# Patient Record
Sex: Female | Born: 1960 | Race: White | Hispanic: No | Marital: Married | Smoking: Former smoker
Health system: Southern US, Community
[De-identification: ages and names within clinical notes are randomized; demographics above are authoritative.]

## PROBLEM LIST (undated history)

## (undated) DIAGNOSIS — H269 Unspecified cataract: Secondary | ICD-10-CM

## (undated) DIAGNOSIS — L738 Other specified follicular disorders: Secondary | ICD-10-CM

## (undated) DIAGNOSIS — E039 Hypothyroidism, unspecified: Principal | ICD-10-CM

## (undated) DIAGNOSIS — C801 Malignant (primary) neoplasm, unspecified: Secondary | ICD-10-CM

## (undated) HISTORY — DX: Other specified follicular disorders: L73.8

## (undated) HISTORY — DX: Hypothyroidism, unspecified: E03.9

## (undated) HISTORY — PX: BREAST REDUCTION SURGERY: SHX8

## (undated) HISTORY — DX: Malignant (primary) neoplasm, unspecified: C80.1

## (undated) HISTORY — DX: Unspecified cataract: H26.9

---

## 1998-02-13 ENCOUNTER — Other Ambulatory Visit: Admission: RE | Admit: 1998-02-13 | Discharge: 1998-02-13 | Payer: Self-pay | Admitting: Gynecology

## 1999-05-19 ENCOUNTER — Other Ambulatory Visit: Admission: RE | Admit: 1999-05-19 | Discharge: 1999-05-19 | Payer: Self-pay | Admitting: Gynecology

## 1999-10-06 ENCOUNTER — Ambulatory Visit (HOSPITAL_COMMUNITY): Admission: RE | Admit: 1999-10-06 | Discharge: 1999-10-06 | Payer: Self-pay | Admitting: Gastroenterology

## 2000-06-03 ENCOUNTER — Other Ambulatory Visit: Admission: RE | Admit: 2000-06-03 | Discharge: 2000-06-03 | Payer: Self-pay | Admitting: Gynecology

## 2001-06-19 ENCOUNTER — Other Ambulatory Visit: Admission: RE | Admit: 2001-06-19 | Discharge: 2001-06-19 | Payer: Self-pay | Admitting: Gynecology

## 2002-07-11 ENCOUNTER — Other Ambulatory Visit: Admission: RE | Admit: 2002-07-11 | Discharge: 2002-07-11 | Payer: Self-pay | Admitting: Gynecology

## 2003-06-13 ENCOUNTER — Other Ambulatory Visit: Admission: RE | Admit: 2003-06-13 | Discharge: 2003-06-13 | Payer: Self-pay | Admitting: Gynecology

## 2004-07-15 ENCOUNTER — Other Ambulatory Visit: Admission: RE | Admit: 2004-07-15 | Discharge: 2004-07-15 | Payer: Self-pay | Admitting: Gynecology

## 2005-08-04 ENCOUNTER — Other Ambulatory Visit: Admission: RE | Admit: 2005-08-04 | Discharge: 2005-08-04 | Payer: Self-pay | Admitting: Gynecology

## 2005-12-13 ENCOUNTER — Encounter: Admission: RE | Admit: 2005-12-13 | Discharge: 2005-12-13 | Payer: Self-pay | Admitting: Family Medicine

## 2006-09-15 ENCOUNTER — Other Ambulatory Visit: Admission: RE | Admit: 2006-09-15 | Discharge: 2006-09-15 | Payer: Self-pay | Admitting: Gynecology

## 2007-02-08 ENCOUNTER — Encounter: Admission: RE | Admit: 2007-02-08 | Discharge: 2007-02-08 | Payer: Self-pay | Admitting: Gynecology

## 2007-12-08 ENCOUNTER — Encounter (INDEPENDENT_AMBULATORY_CARE_PROVIDER_SITE_OTHER): Payer: Self-pay | Admitting: *Deleted

## 2007-12-20 ENCOUNTER — Other Ambulatory Visit: Admission: RE | Admit: 2007-12-20 | Discharge: 2007-12-20 | Payer: Self-pay | Admitting: Gynecology

## 2008-02-27 ENCOUNTER — Encounter: Admission: RE | Admit: 2008-02-27 | Discharge: 2008-02-27 | Payer: Self-pay | Admitting: Gynecology

## 2009-01-20 ENCOUNTER — Ambulatory Visit: Payer: Self-pay | Admitting: Family Medicine

## 2009-01-20 DIAGNOSIS — E039 Hypothyroidism, unspecified: Secondary | ICD-10-CM | POA: Insufficient documentation

## 2009-01-20 DIAGNOSIS — L738 Other specified follicular disorders: Secondary | ICD-10-CM | POA: Insufficient documentation

## 2009-01-20 DIAGNOSIS — L678 Other hair color and hair shaft abnormalities: Secondary | ICD-10-CM

## 2009-01-20 HISTORY — DX: Hypothyroidism, unspecified: E03.9

## 2009-01-20 HISTORY — DX: Other hair color and hair shaft abnormalities: L67.8

## 2009-02-27 ENCOUNTER — Telehealth: Payer: Self-pay | Admitting: Family Medicine

## 2009-03-21 ENCOUNTER — Encounter: Admission: RE | Admit: 2009-03-21 | Discharge: 2009-03-21 | Payer: Self-pay | Admitting: Gynecology

## 2010-01-30 ENCOUNTER — Ambulatory Visit: Payer: Self-pay | Admitting: Family Medicine

## 2010-01-30 LAB — CONVERTED CEMR LAB: TSH: 3.18 microintl units/mL (ref 0.35–5.50)

## 2010-03-19 NOTE — Assessment & Plan Note (Signed)
Summary: [facility] -[facility] TSH//CCM/PT RSC/CJR   Vital Signs:  Patient profile:   50 year old female Menstrual status:  postmenopausal Weight:      133 pounds Temp:     97.5 degrees F oral BP sitting:   90 / 70  (left arm) Cuff size:   regular  Vitals Entered By: Sid Falcon LPN (January 30, 2010 11:07 AM)  History of Present Illness: Patient seen for follow up hypothyroidism. Takes Levothroid 75 micrograms daily.  Compliant with therapy. No fatigue or other issues. She denies any constipation or cold intolerance. Weight stable.  Requesting name of other local GYN physicians.  Has seen Dr Chevis Pretty in past and would like to change.  Allergies (verified): No Known Drug Allergies  Past History:  Past Medical History: Last updated: 01/20/2009 hypothyroid  Family History: Last updated: 01/20/2009 Family History Lung cancer  Social History: Last updated: 01/20/2009 Occupation:  Futures trader, [employer] Married Alcohol use-yes Regular exercise-yes Never Smoked  Risk Factors: Exercise: yes (01/20/2009)  Risk Factors: Smoking Status: never (01/20/2009)  Review of Systems  The patient denies anorexia, fever, weight loss, weight gain, chest pain, and peripheral edema.    Physical Exam  General:  Well-developed,well-nourished,in no acute distress; alert,appropriate and cooperative throughout examination Mouth:  Oral mucosa and oropharynx without lesions or exudates.  Teeth in good repair. Neck:  No deformities, masses, or tenderness noted. Lungs:  Normal respiratory effort, chest expands symmetrically. Lungs are clear to auscultation, no crackles or wheezes. Heart:  Normal rate and regular rhythm. S1 and S2 normal without gallop, murmur, click, rub or other extra sounds. Extremities:  No clubbing, cyanosis, edema, or deformity noted with normal full range of motion of all joints.     Impression & Recommendations:  Problem # 1:  HYPOTHYROIDISM (ICD-244.9) repeat  labs. Her updated medication list for this problem includes:    Levothroid 75 Mcg Tabs (Levothyroxine sodium) ..... Once daily  Orders: TLB-TSH (Thyroid Stimulating Hormone) (84443-TSH)  Problem # 2:  Preventive Health Care (ICD-V70.0) No hx of Tdap and expecting grandchild in June.  Pt consents.  Complete Medication List: 1)  Levothroid 75 Mcg Tabs (Levothyroxine sodium) .... Once daily 2)  Doxycycline Hyclate 100 Mg Caps (Doxycycline hyclate) .... One by mouth two times a day for 10 days 3)  Perque Potent C Guard  .... Dietary supplement one tsp daily with 8 oz h20 4)  D-3 1000  .... Once daily 5)  Vitamin B-12 2500 Mcg Subl (Cyanocobalamin) .... Once daily  Other Orders: Tdap => 39yrs IM (06237) Admin 1st Vaccine (62831)  Patient Instructions: 1)  GYNs: 2)  Huel Cote   [phone] 3)  Floyde Parkins     [phone] 4)  Please schedule a follow-up appointment in 1 year.    Orders Added: 1)  TLB-TSH (Thyroid Stimulating Hormone) [84443-TSH] 2)  Tdap => 14yrs IM [90715] 3)  Admin 1st Vaccine [90471] 4)  Est. Patient Level III [69485]   Immunizations Administered:  Tetanus Vaccine:    Vaccine Type: Tdap    Site: left deltoid    Mfr: GlaxoSmithKline    Dose: 0.5 ml    Route: IM    Given by: Sid Falcon LPN    Exp. Date: 12/06/2011    Lot #: [id number]    VIS given: 01/03/08 version given January 30, 2010.   Immunizations Administered:  Tetanus Vaccine:    Vaccine Type: Tdap    Site: left deltoid    Mfr: GlaxoSmithKline  Dose: 0.5 ml    Route: IM    Given by: Sid Falcon LPN    Exp. Date: 12/06/2011    Lot #: [id number]    VIS given: 01/03/08 version given January 30, 2010.

## 2010-03-19 NOTE — Progress Notes (Signed)
Summary: fyi  Phone Note Call from Patient Call back at Home Phone [phone]   Caller: Patient Call For: Evelena Peat MD Summary of Call: Pt rash on chest has cleared up. abx along with hydrocortisone cream did the job. Initial call taken by: Heron Sabins,  February 27, 2009 10:20 AM  Follow-up for Phone Call        noted Follow-up by: Evelena Peat MD,  February 27, 2009 10:30 AM

## 2010-05-08 ENCOUNTER — Other Ambulatory Visit: Payer: Self-pay | Admitting: Obstetrics and Gynecology

## 2010-05-08 DIAGNOSIS — Z79899 Other long term (current) drug therapy: Secondary | ICD-10-CM

## 2010-05-08 DIAGNOSIS — Z1231 Encounter for screening mammogram for malignant neoplasm of breast: Secondary | ICD-10-CM

## 2010-06-03 ENCOUNTER — Ambulatory Visit
Admission: RE | Admit: 2010-06-03 | Discharge: 2010-06-03 | Disposition: A | Discharge status: Home / Self Care | Payer: BC Managed Care – PPO | Source: Ambulatory Visit | Attending: Obstetrics and Gynecology | Admitting: Obstetrics and Gynecology

## 2010-06-03 DIAGNOSIS — Z79899 Other long term (current) drug therapy: Secondary | ICD-10-CM

## 2010-06-03 DIAGNOSIS — Z1231 Encounter for screening mammogram for malignant neoplasm of breast: Secondary | ICD-10-CM

## 2010-06-11 ENCOUNTER — Telehealth: Payer: Self-pay | Admitting: Family Medicine

## 2010-06-11 NOTE — Telephone Encounter (Signed)
Pt called and said that Dr Senaida Ores, OBGYN, prescribed pt a med for osteoporosis. Pt had a dexa scan done and pt wanted to make Dr Tawanna Cooler aware. Pt does not know name of med, believes it may be Fosomax.

## 2010-07-03 NOTE — Procedures (Signed)
[facility]  Patient:    Sally Brooks, Sally Brooks                     MRN: [id number] Proc. Date: 10/06/99 Adm. Date:  [id number] Disc. Date: [id number] Attending:  Deneen Harts CC:         Leatha Gilding. Mezer, M.D.  [facility]   Procedure Report  PROCEDURE:  Colonoscopy.  ENDOSCOPIST:  Griffith Citron, M.D.  INDICATIONS:  Colorectal neoplasia surveillance, constipation.  INFORMED CONSENT:  After reviewing the nature of the procedure with the patient including potential risks and complications including those of hemorrhage and perforation, informed consent was signed.  DESCRIPTION OF PROCEDURE:  The patient was premedicated receiving Versed 8 mg, fentanyl 75 mcg administered in divided doses prior to and during the course of the procedure.  Using the Olympus PCF-140L pediatric video colonoscope, the rectum was intubated after digital examination revealed no evidence of perianal or intrarectal pathology.  The scope was inserted and advanced without difficulty around the entire length of the colon to the cecum.  This was identified by the appendiceal orifice and ileocecal valve.  Preparation was good throughout. There was some retained Bisacodyl tablets.  The scope was slowly withdrawn with careful inspection of the entire colon in a retrograde manner.  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  There was no evidence throughout the colon of neoplasia.  No other abnormality was noted. No evidence of diverticular disease, mucosal inflammation or vascular lesion. A retroflexed view in the rectal vault was normal.  The colon was decompressed and scope withdrawn.  The patient tolerated the procedure without difficulty being maintained on Datascope monitor and low flow oxygen throughout.  Time 1, technical 1, preparation 2, total score equals 4.  ASSESSMENT: 1. 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 2. No colorectal neoplasia identified. 3. Functional constipation.  RECOMMENDATION: 1. Colonoscopy to be repeated in 10 years - per Dr. Kinnie Scales. 2. Annual Hemoccults, per Dr. Chevis Pretty and/or [facility]. 3. Miralax - titrate dose to achieve daily or every other day bowel movement. 4. Return office visit p.r.n. if constipation persists. DD:  10/06/99 TD:  10/07/99 Job: [id number]

## 2010-12-17 ENCOUNTER — Telehealth: Payer: Self-pay | Admitting: Family Medicine

## 2010-12-17 NOTE — Telephone Encounter (Signed)
Really should be seen but OK to call in Z-max and needs to be seen if no better by next week.

## 2010-12-17 NOTE — Telephone Encounter (Signed)
Pt agreed to come in tomorrow morning 8:30 am

## 2010-12-17 NOTE — Telephone Encounter (Signed)
Pt is calling again for cough RX. With a Zpack,  No fever.

## 2010-12-17 NOTE — Telephone Encounter (Signed)
Pt has has a hacking cough going on 4 weeks and can not seem to shake it. Pt would like to know if she could have a Zpack called in. Pt requesting you contact her

## 2010-12-18 ENCOUNTER — Ambulatory Visit (INDEPENDENT_AMBULATORY_CARE_PROVIDER_SITE_OTHER): Payer: BC Managed Care – PPO | Admitting: Family Medicine

## 2010-12-18 ENCOUNTER — Encounter: Payer: Self-pay | Admitting: Family Medicine

## 2010-12-18 DIAGNOSIS — E039 Hypothyroidism, unspecified: Secondary | ICD-10-CM

## 2010-12-18 DIAGNOSIS — R05 Cough: Secondary | ICD-10-CM

## 2010-12-18 MED ORDER — HYDROCODONE-HOMATROPINE 5-1.5 MG/5ML PO SYRP: 5.0000 mL | ORAL_SOLUTION | Freq: Four times a day (QID) | ORAL | Status: AC | PRN

## 2010-12-18 MED ORDER — AZITHROMYCIN 250 MG PO TABS: ORAL_TABLET | ORAL | Status: AC

## 2010-12-18 MED ORDER — LEVOTHYROXINE SODIUM 75 MCG PO TABS: 75.0000 ug | ORAL_TABLET | Freq: Every day | ORAL | Status: DC

## 2010-12-18 NOTE — Patient Instructions (Signed)
Follow up if cough not improving by end of next week.

## 2010-12-18 NOTE — Progress Notes (Signed)
  Subjective:    Patient ID: Sally Brooks, female    DOB: 01-27-1961, 50 y.o.   MRN: [id number]  HPI  Patient seen with 4 week history of cough. Mostly nonproductive. Patient is nonsmoker. No history of asthma. Some postnasal drip. No fever. Denies any GERD symptoms. No wheezing. He tried over-the-counter cough medications and Sudafed along with antihistamine without much improvement. Cough especially bothersome at night. No dyspnea.  Hypothyroidism. Needs repeat TSH. Needs refills of medication. Compliant with therapy.   Review of Systems  Constitutional: Negative for fever, chills, appetite change, fatigue and unexpected weight change.  Respiratory: Positive for cough. Negative for shortness of breath and wheezing.   Cardiovascular: Negative for chest pain, palpitations and leg swelling.       Objective:   Physical Exam  Constitutional: She appears well-developed and well-nourished.  HENT:  Right Ear: External ear normal.  Left Ear: External ear normal.  Mouth/Throat: Oropharynx is clear and moist.  Neck: Neck supple.  Cardiovascular: Normal rate and regular rhythm.   No murmur heard. Pulmonary/Chest: Effort normal and breath sounds normal. No respiratory distress. She has no wheezes. She has no rales.  Musculoskeletal: She exhibits no edema.  Lymphadenopathy:    She has no cervical adenopathy.          Assessment & Plan:  #1 cough. Differential includes viral bronchitis versus atypical. Start Zithromax. Hycodan for nighttime cough #2 hypothyroidism. Recheck TSH. Refill Synthroid for one year

## 2010-12-22 NOTE — Progress Notes (Signed)
Quick Note:  Pt informed ______ 

## 2011-01-28 ENCOUNTER — Ambulatory Visit (INDEPENDENT_AMBULATORY_CARE_PROVIDER_SITE_OTHER): Payer: BC Managed Care – PPO | Admitting: Family Medicine

## 2011-01-28 ENCOUNTER — Encounter: Payer: Self-pay | Admitting: Family Medicine

## 2011-01-28 VITALS — BP 100/68 | Temp 98.2°F | Wt 139.0 lb

## 2011-01-28 DIAGNOSIS — J029 Acute pharyngitis, unspecified: Secondary | ICD-10-CM

## 2011-01-28 LAB — POCT RAPID STREP A (OFFICE): Rapid Strep A Screen: NEGATIVE

## 2011-01-28 MED ORDER — AMOXICILLIN 875 MG PO TABS: 875.0000 mg | ORAL_TABLET | Freq: Two times a day (BID) | ORAL | Status: AC

## 2011-01-28 NOTE — Progress Notes (Signed)
  Subjective:    Patient ID: Sally Brooks, female    DOB: [birth year], 50 y.o.   MRN: [id number]  HPI  Sore throat. Onset yesterday. Diffuse bodyaches. Minimal indigestion. Intermittent mild headache. Increased malaise. Denies any significant cough. History of frequent strep pharyngitis the past and felt similar them. No recent ill exposures. Denies any nausea or vomiting. No skin rash. Mild improvement with Advil   Review of Systems As per history of present illness    Objective:   Physical Exam  Constitutional: She appears well-developed and well-nourished.  HENT:  Right Ear: External ear normal.  Left Ear: External ear normal.       Erythema posterior pharynx without exudate  Neck: Neck supple.  Cardiovascular: Normal rate and regular rhythm.   Pulmonary/Chest: Effort normal and breath sounds normal. No respiratory distress. She has no wheezes. She has no rales.  Lymphadenopathy:    She has cervical adenopathy (anterior cervical adenopathy).  Skin: No rash noted.          Assessment & Plan:  Pharyngitis. Rapid strep negative. Discussed ddx viral vs strep with false neg screen. She is concerned because of history of frequent strep in the past. Given lack of cough and minimal nasal congestion start amoxicillin 875 mg twice daily for 10 days

## 2011-01-29 ENCOUNTER — Other Ambulatory Visit: Payer: Self-pay | Admitting: Family Medicine

## 2011-02-24 ENCOUNTER — Other Ambulatory Visit: Payer: Self-pay | Admitting: *Deleted

## 2011-02-24 MED ORDER — LEVOTHYROXINE SODIUM 75 MCG PO TABS: 75.0000 ug | ORAL_TABLET | Freq: Every day | ORAL | Status: DC

## 2011-03-01 ENCOUNTER — Telehealth: Payer: Self-pay | Admitting: *Deleted

## 2011-03-01 NOTE — Telephone Encounter (Signed)
error 

## 2011-06-30 ENCOUNTER — Other Ambulatory Visit: Payer: Self-pay | Admitting: Obstetrics and Gynecology

## 2011-06-30 DIAGNOSIS — Z1231 Encounter for screening mammogram for malignant neoplasm of breast: Secondary | ICD-10-CM

## 2011-07-14 ENCOUNTER — Ambulatory Visit
Admission: RE | Admit: 2011-07-14 | Discharge: 2011-07-14 | Disposition: A | Discharge status: Home / Self Care | Payer: BC Managed Care – PPO | Source: Ambulatory Visit | Attending: Obstetrics and Gynecology | Admitting: Obstetrics and Gynecology

## 2011-07-14 DIAGNOSIS — Z1231 Encounter for screening mammogram for malignant neoplasm of breast: Secondary | ICD-10-CM

## 2011-08-11 ENCOUNTER — Ambulatory Visit (INDEPENDENT_AMBULATORY_CARE_PROVIDER_SITE_OTHER): Payer: BC Managed Care – PPO | Admitting: Family Medicine

## 2011-08-11 ENCOUNTER — Encounter: Payer: Self-pay | Admitting: Family Medicine

## 2011-08-11 VITALS — BP 102/62 | Temp 98.0°F | Wt 146.0 lb

## 2011-08-11 DIAGNOSIS — R300 Dysuria: Secondary | ICD-10-CM

## 2011-08-11 LAB — POCT URINALYSIS DIPSTICK
Bilirubin, UA: NEGATIVE
Glucose, UA: NEGATIVE
Nitrite, UA: NEGATIVE

## 2011-08-11 MED ORDER — NITROFURANTOIN MONOHYD MACRO 100 MG PO CAPS: 100.0000 mg | ORAL_CAPSULE | Freq: Two times a day (BID) | ORAL | Status: DC

## 2011-08-11 NOTE — Progress Notes (Signed)
  Subjective:    Patient ID: Sally Brooks, female    DOB: Mar 24, 1960, 51 y.o.   MRN: [id number]  HPI  Dysuria. Onset one week ago. Urine frequency and burning with urination. Denies any nausea or vomiting. Intermittent left lower back pain. No fever or chills. No history of known allergies. No history of recent UTI.   Review of Systems  Constitutional: Negative for fever, chills and appetite change.  Gastrointestinal: Negative for nausea, vomiting, abdominal pain, diarrhea and constipation.  Genitourinary: Positive for dysuria and frequency.  Musculoskeletal: Negative for back pain.  Neurological: Negative for dizziness.       Objective:   Physical Exam  Constitutional: She appears well-developed and well-nourished.  HENT:  Head: Normocephalic and atraumatic.  Neck: Neck supple. No thyromegaly present.  Cardiovascular: Normal rate, regular rhythm and normal heart sounds.   Pulmonary/Chest: Breath sounds normal.  Abdominal: Soft. Bowel sounds are normal. There is no tenderness.          Assessment & Plan:  Uncomplicated cystitis. Macrobid one twice a day for 5 days. Plenty fluids. Followup when necessary

## 2011-08-11 NOTE — Patient Instructions (Signed)

## 2011-12-07 ENCOUNTER — Telehealth: Payer: Self-pay | Admitting: Family Medicine

## 2011-12-07 MED ORDER — NITROFURANTOIN MONOHYD MACRO 100 MG PO CAPS: 100.0000 mg | ORAL_CAPSULE | Freq: Two times a day (BID) | ORAL | Status: AC

## 2011-12-07 NOTE — Telephone Encounter (Signed)
May refill Macrobid once but will need UA if sxs persist.

## 2011-12-07 NOTE — Telephone Encounter (Signed)
Med was macrobid from history

## 2011-12-07 NOTE — Telephone Encounter (Signed)
Pt informed, Rx sent, very appreciative

## 2011-12-07 NOTE — Telephone Encounter (Signed)
I called pt and informed her she would need OV so we can check her urine again, etc.  I offered assistance with scheduling with another provider and she was insistent that I ask the doctor, "last time the med worked so well in just 3 days, please ask him"?

## 2011-12-07 NOTE — Telephone Encounter (Signed)
Pt was seen on 08-11-2011 for uti. Pt is experiencing same symptoms patient would like same abx call into [facility]

## 2012-03-15 ENCOUNTER — Ambulatory Visit (INDEPENDENT_AMBULATORY_CARE_PROVIDER_SITE_OTHER): Payer: BC Managed Care – PPO | Admitting: Family Medicine

## 2012-03-15 ENCOUNTER — Encounter: Payer: Self-pay | Admitting: Family Medicine

## 2012-03-15 VITALS — BP 100/70 | Temp 98.4°F | Wt 144.0 lb

## 2012-03-15 DIAGNOSIS — E039 Hypothyroidism, unspecified: Secondary | ICD-10-CM

## 2012-03-15 MED ORDER — LEVOTHYROXINE SODIUM 75 MCG PO TABS: 75.0000 ug | ORAL_TABLET | Freq: Every day | ORAL | Status: DC

## 2012-03-15 NOTE — Progress Notes (Signed)
  Subjective:    Patient ID: Sally Brooks, female    DOB: 1960/08/12, 52 y.o.   MRN: [id number]  HPI Followup hypothyroidism. Takes levothyroxin 75 mcg daily. Generally feels well. She saw a urologist last year for some stress urinary incontinence. She's not decided to pursue surgery this time. Generally has good energy levels. Weight stable. Compliant with medication. Nonsmoker.  Past Medical History  Diagnosis Date  . HYPOTHYROIDISM 01/20/2009  . FOLLICULITIS 01/20/2009   No past surgical history on file.  reports that she quit smoking about 33 years ago. Her smoking use included Cigarettes. She has a 2.5 pack-year smoking history. She does not have any smokeless tobacco history on file. Her alcohol and drug histories not on file. family history includes Cancer in her other. No Known Allergies    Review of Systems  Constitutional: Negative for appetite change and unexpected weight change.  Respiratory: Negative for shortness of breath.   Cardiovascular: Negative for chest pain.       Objective:   Physical Exam  Constitutional: She appears well-developed and well-nourished.  Neck: Neck supple. No thyromegaly present.  Cardiovascular: Normal rate and regular rhythm.   Pulmonary/Chest: Effort normal and breath sounds normal. No respiratory distress. She has no wheezes. She has no rales.  Musculoskeletal: She exhibits no edema.  Lymphadenopathy:    She has no cervical adenopathy.          Assessment & Plan:  #1 hypothyroidism. Recheck TSH. Refill medication for one year. She continues see gynecologist for physicals

## 2012-03-16 NOTE — Progress Notes (Signed)
Quick Note:  Pt informed ______ 

## 2012-09-07 ENCOUNTER — Ambulatory Visit (INDEPENDENT_AMBULATORY_CARE_PROVIDER_SITE_OTHER): Payer: BC Managed Care – PPO | Admitting: Nurse Practitioner

## 2012-09-07 ENCOUNTER — Encounter: Payer: Self-pay | Admitting: Nurse Practitioner

## 2012-09-07 VITALS — BP 100/60 | HR 91 | Temp 97.4°F | Ht 61.0 in | Wt 140.5 lb

## 2012-09-07 DIAGNOSIS — L255 Unspecified contact dermatitis due to plants, except food: Secondary | ICD-10-CM

## 2012-09-07 MED ORDER — METHYLPREDNISOLONE ACETATE 40 MG/ML IJ SUSP
40.0000 mg | Freq: Once | INTRAMUSCULAR | Status: AC
  Administered 2012-09-07: 40 mg via INTRAMUSCULAR

## 2012-09-07 MED ORDER — PREDNISONE 10 MG PO TABS: ORAL_TABLET | ORAL | Status: DC

## 2012-09-07 NOTE — Patient Instructions (Signed)
Continue with oral prednisone. Do not take any more today. Tomorrow start with prescription I have written. This rash will gradually resolve over the next 10-15 days. Good skin care is ZOX:WRUE soaps, bacitracin for red irritated areas. Topical benadryl and ice packs are helpful for itch. Heat is always going to make rash more red & itchy, so after showers you may notice increased irritation. You may also take claritin 10 mg daily until rash resolves. Feel better!  Poison [employer] ivy is a inflammation of the skin (contact dermatitis) caused by touching the allergens on the leaves of the ivy plant following previous exposure to the plant. The rash usually appears 48 hours after exposure. The rash is usually bumps (papules) or blisters (vesicles) in a linear pattern. Depending on your own sensitivity, the rash may simply cause redness and itching, or it may also progress to blisters which may break open. These must be well cared for to prevent secondary bacterial (germ) infection, followed by scarring. Keep any open areas dry, clean, dressed, and covered with an antibacterial ointment if needed. The eyes may also get puffy. The puffiness is worst in the morning and gets better as the day progresses. This dermatitis usually heals without scarring, within 2 to 3 weeks without treatment. HOME CARE INSTRUCTIONS  Thoroughly wash with soap and water as soon as you have been exposed to poison ivy. You have about one half hour to remove the plant resin before it will cause the rash. This washing will destroy the oil or antigen on the skin that is causing, or will cause, the rash. Be sure to wash under your fingernails as any plant resin there will continue to spread the rash. Do not rub skin vigorously when washing affected area. Poison ivy cannot spread if no oil from the plant remains on your body. A rash that has progressed to weeping sores will not spread the rash unless you have not washed thoroughly. It is also  important to wash any clothes you have been wearing as these may carry active allergens. The rash will return if you wear the unwashed clothing, even several days later. Avoidance of the plant in the future is the best measure. Poison ivy plant can be recognized by the number of leaves. Generally, poison ivy has three leaves with flowering branches on a single stem. Diphenhydramine may be purchased over the counter and used as needed for itching. Do not drive with this medication if it makes you drowsy.Ask your caregiver about medication for children. SEEK MEDICAL CARE IF:  Open sores develop.  Redness spreads beyond area of rash.  You notice purulent (pus-like) discharge.  You have increased pain.  Other signs of infection develop (such as fever). Document Released: 01/30/2000 Document Revised: 04/26/2011 Document Reviewed: 12/18/2008 [facility] Patient Information 2014 [place], [place].

## 2012-09-07 NOTE — Progress Notes (Signed)
  Subjective:    Patient ID: Sally Brooks, female    DOB: [birth year], 52 y.o.   MRN: [id number]  Poison [place] This is a new problem. The current episode started in the past 7 days (Rash for 7 days). The problem has been gradually worsening since onset. The affected locations include the face and right arm. The rash is characterized by blistering, itchiness, redness and swelling. She was exposed to plant contact. Associated symptoms include facial edema. Pertinent negatives include no cough, fever or shortness of breath. Past treatments include oral steroids (pt has been taking prednisone taper for last 6 days-3D of 60mg , 3D of 40mg ). The treatment provided no relief. There is no history of asthma.      Review of Systems  Constitutional: Negative for fever.  HENT: Positive for facial swelling. Negative for trouble swallowing and voice change.   Eyes: Negative for redness.  Respiratory: Negative for cough, chest tightness, shortness of breath and wheezing.   Gastrointestinal: Negative for abdominal pain.  Skin: Positive for rash (various stages of blistering rash on R side of face, neck, R hand & arm.).       Objective:   Physical Exam  Vitals reviewed. Constitutional: She is oriented to person, place, and time. She appears well-developed and well-nourished. She appears distressed (mildly distressed due to itchiness of rash).  HENT:  Head: Normocephalic and atraumatic.  Eyes: Conjunctivae are normal. Right eye exhibits no discharge. Left eye exhibits no discharge.  Cardiovascular: Normal rate, regular rhythm and normal heart sounds.   No murmur heard. Pulmonary/Chest: Effort normal and breath sounds normal. No respiratory distress. She has no wheezes.  Musculoskeletal:  Normal gait  Neurological: She is alert and oriented to person, place, and time.  Skin: Skin is warm and dry. Rash (  various stages of blistering rash on R side of face, neck, R hand & arm) noted.  Psychiatric: She has  a normal mood and affect. Her behavior is normal. Thought content normal.  Has felt anxious last few days, thinks r/t son going into marines.          Assessment & Plan:  1. Plant dermatitis Discussed need for 14-21 days of oral pred taper, typical duration of rash, SE of prednisone-including anxiety. Pt is insistent that a "steroid injection" will make her better. Cautioned that it may make her anxiety temporarily worse, and is not likely to make great strides with improving rash since she has been taking oral prednisone for 6 days already.  - methylPREDNISolone acetate (DEPO-MEDROL) injection 40 mg; Inject 1 mL (40 mg total) into the muscle once. - predniSONE (DELTASONE) 10 MG tablet; Take 3T po QD for 3d, then 2T po qd 3D, then 1T po qd 3D, then 1/2 T po QD 3D  Dispense: 20 tablet; Refill: 0  See instructions for skin care.

## 2012-09-08 ENCOUNTER — Ambulatory Visit: Payer: BC Managed Care – PPO | Admitting: Family Medicine

## 2012-09-13 ENCOUNTER — Telehealth: Payer: Self-pay | Admitting: *Deleted

## 2012-09-13 NOTE — Telephone Encounter (Signed)
Patient was in to see Layne on 09/07/12 for poison oak. Patient received a depo injection when she was here and some of the rash dried up. However patient stated that the has spread to other areas. Patient had been to urgent care before she saw Layne and urgent care gave her a prednisone rx. Patient hasn't taken the rx yet and she wants to make sure that the directions are right. Take 3 tablets qd for 3 days, then 2 tablets qd for 3 days, then 1 tablet qd for 3 days, then 1/2 tablet qd for 3 days. Please advise?

## 2012-09-13 NOTE — Telephone Encounter (Signed)
Per Dr. Milinda Cave. Called patient to let her know that she should follow directions when taking medication. Patient expressed understanding.

## 2013-01-01 ENCOUNTER — Other Ambulatory Visit: Payer: Self-pay

## 2013-01-01 ENCOUNTER — Other Ambulatory Visit: Payer: Self-pay | Admitting: Obstetrics and Gynecology

## 2013-01-01 DIAGNOSIS — M810 Age-related osteoporosis without current pathological fracture: Secondary | ICD-10-CM

## 2013-01-01 DIAGNOSIS — Z1231 Encounter for screening mammogram for malignant neoplasm of breast: Secondary | ICD-10-CM

## 2013-01-01 DIAGNOSIS — Z9889 Other specified postprocedural states: Secondary | ICD-10-CM

## 2013-02-09 ENCOUNTER — Other Ambulatory Visit: Payer: BC Managed Care – PPO

## 2013-02-13 ENCOUNTER — Other Ambulatory Visit: Payer: BC Managed Care – PPO

## 2013-02-13 ENCOUNTER — Ambulatory Visit: Payer: BC Managed Care – PPO

## 2013-02-19 ENCOUNTER — Ambulatory Visit
Admission: RE | Admit: 2013-02-19 | Discharge: 2013-02-19 | Disposition: A | Discharge status: Home / Self Care | Payer: BC Managed Care – PPO | Source: Ambulatory Visit | Attending: Obstetrics and Gynecology | Admitting: Obstetrics and Gynecology

## 2013-02-19 ENCOUNTER — Ambulatory Visit
Admission: RE | Admit: 2013-02-19 | Discharge: 2013-02-19 | Disposition: A | Discharge status: Home / Self Care | Payer: BC Managed Care – PPO | Source: Ambulatory Visit

## 2013-02-19 DIAGNOSIS — Z9889 Other specified postprocedural states: Secondary | ICD-10-CM

## 2013-02-19 DIAGNOSIS — M810 Age-related osteoporosis without current pathological fracture: Secondary | ICD-10-CM

## 2013-02-19 DIAGNOSIS — Z1231 Encounter for screening mammogram for malignant neoplasm of breast: Secondary | ICD-10-CM

## 2013-02-28 ENCOUNTER — Ambulatory Visit: Payer: BC Managed Care – PPO | Admitting: Family Medicine

## 2013-04-06 ENCOUNTER — Encounter: Payer: Self-pay | Admitting: Family Medicine

## 2013-04-06 ENCOUNTER — Ambulatory Visit (INDEPENDENT_AMBULATORY_CARE_PROVIDER_SITE_OTHER): Payer: BC Managed Care – PPO | Admitting: Family Medicine

## 2013-04-06 VITALS — BP 100/70 | HR 66 | Temp 97.6°F | Wt 150.0 lb

## 2013-04-06 DIAGNOSIS — Z13220 Encounter for screening for lipoid disorders: Secondary | ICD-10-CM

## 2013-04-06 DIAGNOSIS — E039 Hypothyroidism, unspecified: Secondary | ICD-10-CM

## 2013-04-06 DIAGNOSIS — M8580 Other specified disorders of bone density and structure, unspecified site: Secondary | ICD-10-CM

## 2013-04-06 DIAGNOSIS — M899 Disorder of bone, unspecified: Secondary | ICD-10-CM

## 2013-04-06 DIAGNOSIS — M949 Disorder of cartilage, unspecified: Secondary | ICD-10-CM

## 2013-04-06 LAB — LIPID PANEL
CHOL/HDL RATIO: 4
Cholesterol: 291 mg/dL — ABNORMAL HIGH (ref 0–200)
HDL: 68.3 mg/dL (ref >39.00)
TRIGLYCERIDES: 80 mg/dL (ref 0.0–149.0)
VLDL: 16 mg/dL (ref 0.0–40.0)

## 2013-04-06 LAB — LDL CHOLESTEROL, DIRECT: LDL DIRECT: 226.1 mg/dL

## 2013-04-06 LAB — TSH: TSH: 5.31 u[IU]/mL (ref 0.35–5.50)

## 2013-04-06 MED ORDER — LEVOTHYROXINE SODIUM 75 MCG PO TABS: 75.0000 ug | ORAL_TABLET | Freq: Every day | ORAL | Status: DC

## 2013-04-06 NOTE — Progress Notes (Signed)
   Subjective:    Patient ID: Sally Brooks, female    DOB: 1960/05/21, 53 y.o.   MRN: [id number]  HPI Patient seen for followup regarding hypothyroidism. Takes levothyroxin 75 mcg daily. Compliant with therapy. She has no specific complaints. She also relates history of osteoporosis versus osteopenia per DEXA scan from gynecologist. We do not have those records. She denies ever having vitamin D level checked.  Past Medical History  Diagnosis Date  . HYPOTHYROIDISM 01/20/2009  . FOLLICULITIS 01/0/9323   No past surgical history on file.  reports that she quit smoking about 34 years ago. Her smoking use included Cigarettes. She has a 2.5 pack-year smoking history. She does not have any smokeless tobacco history on file. Her alcohol and drug histories are not on file. family history includes Cancer in her other. No Known Allergies    Review of Systems  Constitutional: Negative for appetite change and unexpected weight change.  Respiratory: Negative for shortness of breath.   Cardiovascular: Negative for chest pain.  Gastrointestinal: Negative for constipation.  Endocrine: Negative for cold intolerance.  Neurological: Negative for dizziness and headaches.       Objective:   Physical Exam  Constitutional: She appears well-developed and well-nourished.  Neck: Neck supple. No thyromegaly present.  Cardiovascular: Normal rate and regular rhythm.   Pulmonary/Chest: Effort normal and breath sounds normal. No respiratory distress. She has no wheezes. She has no rales.  Musculoskeletal: She exhibits no edema.          Assessment & Plan:  Hypothyroidism. Recheck TSH. Refill levothyroxin for one year. Patient requesting lipid level and vitamin D be screened as well

## 2013-04-06 NOTE — Progress Notes (Signed)
Pre visit review using our clinic review tool, if applicable. No additional management support is needed unless otherwise documented below in the visit note. 

## 2013-04-07 LAB — VITAMIN D 25 HYDROXY (VIT D DEFICIENCY, FRACTURES): Vit D, 25-Hydroxy: 74 ng/mL (ref 30–89)

## 2013-06-25 ENCOUNTER — Telehealth: Payer: Self-pay | Admitting: Family Medicine

## 2013-06-25 MED ORDER — LEVOTHYROXINE SODIUM 75 MCG PO TABS: 75.0000 ug | ORAL_TABLET | Freq: Every day | ORAL | Status: DC

## 2013-06-25 NOTE — Telephone Encounter (Signed)
Rx sent to pharmacy   

## 2013-06-25 NOTE — Telephone Encounter (Signed)
[facility] - [place], [place] - [address] is requesting re-fill on levothyroxine (SYNTHROID, LEVOTHROID) 75 MCG tablet ° °

## 2014-01-14 ENCOUNTER — Telehealth: Payer: Self-pay

## 2014-01-14 MED ORDER — LEVOTHYROXINE SODIUM 75 MCG PO TABS: 75.0000 ug | ORAL_TABLET | Freq: Every day | ORAL | Status: DC

## 2014-01-14 NOTE — Telephone Encounter (Signed)
Rx sent to mail order

## 2014-01-14 NOTE — Telephone Encounter (Signed)
Rx request from [facility] for levothyroxine sodium 45mcg #90.  Pls advise.

## 2014-03-29 ENCOUNTER — Other Ambulatory Visit (INDEPENDENT_AMBULATORY_CARE_PROVIDER_SITE_OTHER): Payer: 59

## 2014-03-29 DIAGNOSIS — Z0000 Encounter for general adult medical examination without abnormal findings: Secondary | ICD-10-CM

## 2014-03-29 LAB — BASIC METABOLIC PANEL
BUN: 12 mg/dL (ref 6–23)
CO2: 28 meq/L (ref 19–32)
Calcium: 9.5 mg/dL (ref 8.4–10.5)
Chloride: 105 mEq/L (ref 96–112)
Creatinine, Ser: 0.92 mg/dL (ref 0.40–1.20)
GFR: 67.13 mL/min (ref >60.00)
Glucose, Bld: 90 mg/dL (ref 70–99)
Potassium: 5.1 mEq/L (ref 3.5–5.1)
SODIUM: 138 meq/L (ref 135–145)

## 2014-03-29 LAB — CBC WITH DIFFERENTIAL/PLATELET
BASOS ABS: 0 10*3/uL (ref 0.0–0.1)
Basophils Relative: 0.6 % (ref 0.0–3.0)
Eosinophils Absolute: 0.1 10*3/uL (ref 0.0–0.7)
Eosinophils Relative: 3.6 % (ref 0.0–5.0)
HCT: 40.6 % (ref 36.0–46.0)
Hemoglobin: 13.6 g/dL (ref 12.0–15.0)
LYMPHS PCT: 36.3 % (ref 12.0–46.0)
Lymphs Abs: 1.5 10*3/uL (ref 0.7–4.0)
MCHC: 33.6 g/dL (ref 30.0–36.0)
MCV: 94.1 fl (ref 78.0–100.0)
MONOS PCT: 11 % (ref 3.0–12.0)
Monocytes Absolute: 0.4 10*3/uL (ref 0.1–1.0)
NEUTROS ABS: 1.9 10*3/uL (ref 1.4–7.7)
NEUTROS PCT: 48.5 % (ref 43.0–77.0)
Platelets: 205 10*3/uL (ref 150.0–400.0)
RBC: 4.32 Mil/uL (ref 3.87–5.11)
RDW: 13 % (ref 11.5–15.5)
WBC: 4 10*3/uL (ref 4.0–10.5)

## 2014-03-29 LAB — LIPID PANEL
CHOL/HDL RATIO: 4
Cholesterol: 249 mg/dL — ABNORMAL HIGH (ref 0–200)
HDL: 65.1 mg/dL (ref >39.00)
LDL CALC: 157 mg/dL — AB (ref 0–99)
NonHDL: 183.9
TRIGLYCERIDES: 133 mg/dL (ref 0.0–149.0)
VLDL: 26.6 mg/dL (ref 0.0–40.0)

## 2014-03-29 LAB — HEPATIC FUNCTION PANEL
ALK PHOS: 51 U/L (ref 39–117)
ALT: 17 U/L (ref 0–35)
AST: 17 U/L (ref 0–37)
Albumin: 4.4 g/dL (ref 3.5–5.2)
BILIRUBIN DIRECT: 0.1 mg/dL (ref 0.0–0.3)
TOTAL PROTEIN: 7.4 g/dL (ref 6.0–8.3)
Total Bilirubin: 0.5 mg/dL (ref 0.2–1.2)

## 2014-03-29 LAB — TSH: TSH: 3.2 u[IU]/mL (ref 0.35–4.50)

## 2014-04-08 ENCOUNTER — Ambulatory Visit (INDEPENDENT_AMBULATORY_CARE_PROVIDER_SITE_OTHER): Payer: 59 | Admitting: Family Medicine

## 2014-04-08 VITALS — BP 102/70 | HR 74 | Temp 97.5°F | Wt 148.0 lb

## 2014-04-08 DIAGNOSIS — Z0000 Encounter for general adult medical examination without abnormal findings: Secondary | ICD-10-CM

## 2014-04-08 MED ORDER — TRAZODONE HCL 50 MG PO TABS: 50.0000 mg | ORAL_TABLET | Freq: Every evening | ORAL | Status: DC | PRN

## 2014-04-08 NOTE — Progress Notes (Signed)
Pre visit review using our clinic review tool, if applicable. No additional management support is needed unless otherwise documented below in the visit note. 

## 2014-04-08 NOTE — Patient Instructions (Signed)
Insomnia Insomnia is frequent trouble falling and/or staying asleep. Insomnia can be a long term problem or a short term problem. Both are common. Insomnia can be a short term problem when the wakefulness is related to a certain stress or worry. Long term insomnia is often related to ongoing stress during waking hours and/or poor sleeping habits. Overtime, sleep deprivation itself can make the problem worse. Every little thing feels more severe because you are overtired and your ability to cope is decreased. CAUSES   Stress, anxiety, and depression.  Poor sleeping habits.  Distractions such as TV in the bedroom.  Naps close to bedtime.  Engaging in emotionally charged conversations before bed.  Technical reading before sleep.  Alcohol and other sedatives. They may make the problem worse. They can hurt normal sleep patterns and normal dream activity.  Stimulants such as caffeine for several hours prior to bedtime.  Pain syndromes and shortness of breath can cause insomnia.  Exercise late at night.  Changing time zones may cause sleeping problems (jet lag). It is sometimes helpful to have someone observe your sleeping patterns. They should look for periods of not breathing during the night (sleep apnea). They should also look to see how long those periods last. If you live alone or observers are uncertain, you can also be observed at a sleep clinic where your sleep patterns will be professionally monitored. Sleep apnea requires a checkup and treatment. Give your caregivers your medical history. Give your caregivers observations your family has made about your sleep.  SYMPTOMS   Not feeling rested in the morning.  Anxiety and restlessness at bedtime.  Difficulty falling and staying asleep. TREATMENT   Your caregiver may prescribe treatment for an underlying medical disorders. Your caregiver can give advice or help if you are using alcohol or other drugs for self-medication. Treatment  of underlying problems will usually eliminate insomnia problems.  Medications can be prescribed for short time use. They are generally not recommended for lengthy use.  Over-the-counter sleep medicines are not recommended for lengthy use. They can be habit forming.  You can promote easier sleeping by making lifestyle changes such as:  Using relaxation techniques that help with breathing and reduce muscle tension.  Exercising earlier in the day.  Changing your diet and the time of your last meal. No night time snacks.  Establish a regular time to go to bed.  Counseling can help with stressful problems and worry.  Soothing music and white noise may be helpful if there are background noises you cannot remove.  Stop tedious detailed work at least one hour before bedtime. HOME CARE INSTRUCTIONS   Keep a diary. Inform your caregiver about your progress. This includes any medication side effects. See your caregiver regularly. Take note of:  Times when you are asleep.  Times when you are awake during the night.  The quality of your sleep.  How you feel the next day. This information will help your caregiver care for you.  Get out of bed if you are still awake after 15 minutes. Read or do some quiet activity. Keep the lights down. Wait until you feel sleepy and go back to bed.  Keep regular sleeping and waking hours. Avoid naps.  Exercise regularly.  Avoid distractions at bedtime. Distractions include watching television or engaging in any intense or detailed activity like attempting to balance the household checkbook.  Develop a bedtime ritual. Keep a familiar routine of bathing, brushing your teeth, climbing into bed at the same   time each night, listening to soothing music. Routines increase the success of falling to sleep faster.  Use relaxation techniques. This can be using breathing and muscle tension release routines. It can also include visualizing peaceful scenes. You can  also help control troubling or intruding thoughts by keeping your mind occupied with boring or repetitive thoughts like the old concept of counting sheep. You can make it more creative like imagining planting one beautiful flower after another in your backyard garden.  During your day, work to eliminate stress. When this is not possible use some of the previous suggestions to help reduce the anxiety that accompanies stressful situations. MAKE SURE YOU:   Understand these instructions.  Will watch your condition.  Will get help right away if you are not doing well or get worse. Document Released: 01/30/2000 Document Revised: 04/26/2011 Document Reviewed: 03/01/2007 ExitCare Patient Information 2015 [employer]. This information is not intended to replace advice given to you by your health care provider. Make sure you discuss any questions you have with your health care provider.  

## 2014-04-08 NOTE — Progress Notes (Signed)
   Subjective:    Patient ID: Sally Brooks, female    DOB: 07/07/1960, 54 y.o.   MRN: [id number]  HPI Patient seen for complete physical. She sees gynecologist regularly. She has history of osteopenia and has been on Fosamax. She has hypothyroidism treated with levothyroxin. Nonsmoker. She gets regular mammograms yearly.  She's had some episodes recently where she had difficulty sleeping. She sometimes awake until 4 AM. Denies any specific stressors. Denies late day use of caffeine. No regular use of alcohol. She is had occasional benefits with melatonin but inconsistently. She has had intolerance with Benadryl.  No current depression issues.  Past Medical History  Diagnosis Date  . HYPOTHYROIDISM 01/20/2009  . FOLLICULITIS 63/04/3543   No past surgical history on file.  reports that she quit smoking about 35 years ago. Her smoking use included Cigarettes. She has a 2.5 pack-year smoking history. She does not have any smokeless tobacco history on file. Her alcohol and drug histories are not on file. family history includes Cancer in her other. No Known Allergies    Review of Systems  Constitutional: Negative for fever, activity change, appetite change, fatigue and unexpected weight change.  HENT: Negative for ear pain, hearing loss, sore throat and trouble swallowing.   Eyes: Negative for visual disturbance.  Respiratory: Negative for cough and shortness of breath.   Cardiovascular: Negative for chest pain and palpitations.  Gastrointestinal: Negative for abdominal pain, diarrhea, constipation and blood in stool.  Genitourinary: Negative for dysuria and hematuria.  Musculoskeletal: Negative for myalgias, back pain and arthralgias.  Skin: Negative for rash.  Neurological: Negative for dizziness, syncope and headaches.  Hematological: Negative for adenopathy.  Psychiatric/Behavioral: Positive for sleep disturbance. Negative for confusion and dysphoric mood.       Objective:   Physical Exam  Constitutional: She is oriented to person, place, and time. She appears well-developed and well-nourished.  HENT:  Head: Normocephalic and atraumatic.  Eyes: EOM are normal. Pupils are equal, round, and reactive to light.  Neck: Normal range of motion. Neck supple. No thyromegaly present.  Cardiovascular: Normal rate, regular rhythm and normal heart sounds.   No murmur heard. Pulmonary/Chest: Breath sounds normal. No respiratory distress. She has no wheezes. She has no rales.  Abdominal: Soft. Bowel sounds are normal. She exhibits no distension and no mass. There is no tenderness. There is no rebound and no guarding.  Genitourinary:  Per GYN  Musculoskeletal: Normal range of motion. She exhibits no edema.  Lymphadenopathy:    She has no cervical adenopathy.  Neurological: She is alert and oriented to person, place, and time. She displays normal reflexes. No cranial nerve deficit.  Skin: No rash noted.  Psychiatric: She has a normal mood and affect. Her behavior is normal. Judgment and thought content normal.          Assessment & Plan:  Complete physical. Labs reviewed with no major concerns. She has hypercholesterolemia with 10 year risk of CAD only 1%.  Continue GYN follow-up. We had a fairly lengthy discussion regarding insomnia. Sleep hygiene discussed with handout given. Trial of trazodone 50 mg daily at bedtime when necessary. She wishes to avoid regular use of medication.

## 2014-06-26 ENCOUNTER — Other Ambulatory Visit: Payer: Self-pay | Admitting: Family Medicine

## 2014-08-02 ENCOUNTER — Telehealth: Payer: Self-pay | Admitting: Family Medicine

## 2014-08-02 ENCOUNTER — Other Ambulatory Visit: Payer: Self-pay | Admitting: Family Medicine

## 2014-08-02 MED ORDER — LEVOTHYROXINE SODIUM 75 MCG PO TABS: ORAL_TABLET | ORAL | Status: DC

## 2014-08-02 MED ORDER — TRAZODONE HCL 50 MG PO TABS: 50.0000 mg | ORAL_TABLET | Freq: Every evening | ORAL | Status: DC | PRN

## 2014-08-02 NOTE — Telephone Encounter (Addendum)
Pt would Like name brand only trazodone 50 mg #90 W/REFILLS [facility]. Pt also would like name brand only synthroid 75 mcg #90 w/refills [facility] to [facility]. Per pt disregard [facility]

## 2014-08-02 NOTE — Telephone Encounter (Signed)
Rx's sent to mail order.  

## 2014-08-06 ENCOUNTER — Telehealth: Payer: Self-pay | Admitting: *Deleted

## 2014-08-06 NOTE — Telephone Encounter (Signed)
In regards to e-rx for Tazodone (Desyrel) 50 mg written on 08/02/2014, is unable to dispense because brand name Desyrel 50 mg is off the market - no longer available. Generic Trazodone is available.  Okay to switch? [facility]

## 2014-08-06 NOTE — Telephone Encounter (Signed)
OK with me.  Let patient know this.  She is the one who requested non-generic and I did not know non-generic was not available.

## 2014-08-07 MED ORDER — TRAZODONE HCL 50 MG PO TABS: 50.0000 mg | ORAL_TABLET | Freq: Every evening | ORAL | Status: DC | PRN

## 2014-08-07 NOTE — Telephone Encounter (Signed)
Rx sent and patient is aware. 

## 2014-12-06 ENCOUNTER — Telehealth: Payer: Self-pay | Admitting: Family Medicine

## 2014-12-06 NOTE — Telephone Encounter (Signed)
Milligrams would be the same. Synthroid 75 g once daily-call in as do not substitute. Patient will need follow-up TSH by February 2017

## 2014-12-06 NOTE — Telephone Encounter (Signed)
Pt call to say she no longer wants the following med levothyroxine (SYNTHROID, LEVOTHROID) 75 MCG tablet  She would like the name brand and not the generic and is asking if it would be the same dosage .    [phone]

## 2014-12-06 NOTE — Telephone Encounter (Signed)
Rx was call in  

## 2015-02-08 ENCOUNTER — Other Ambulatory Visit: Payer: Self-pay | Admitting: Family Medicine

## 2015-03-27 ENCOUNTER — Other Ambulatory Visit: Payer: Self-pay | Admitting: Obstetrics and Gynecology

## 2015-03-27 DIAGNOSIS — M810 Age-related osteoporosis without current pathological fracture: Secondary | ICD-10-CM

## 2015-04-03 ENCOUNTER — Ambulatory Visit
Admission: RE | Admit: 2015-04-03 | Discharge: 2015-04-03 | Disposition: A | Discharge status: Home / Self Care | Payer: 59 | Source: Ambulatory Visit | Attending: Obstetrics and Gynecology | Admitting: Obstetrics and Gynecology

## 2015-04-03 DIAGNOSIS — M810 Age-related osteoporosis without current pathological fracture: Secondary | ICD-10-CM

## 2015-04-28 ENCOUNTER — Other Ambulatory Visit (INDEPENDENT_AMBULATORY_CARE_PROVIDER_SITE_OTHER): Payer: 59

## 2015-04-28 DIAGNOSIS — Z0000 Encounter for general adult medical examination without abnormal findings: Secondary | ICD-10-CM

## 2015-04-28 LAB — CBC WITH DIFFERENTIAL/PLATELET
BASOS ABS: 0 10*3/uL (ref 0.0–0.1)
Basophils Relative: 0.9 % (ref 0.0–3.0)
Eosinophils Absolute: 0.1 10*3/uL (ref 0.0–0.7)
Eosinophils Relative: 2.8 % (ref 0.0–5.0)
HCT: 42 % (ref 36.0–46.0)
Hemoglobin: 14.3 g/dL (ref 12.0–15.0)
LYMPHS ABS: 1.5 10*3/uL (ref 0.7–4.0)
LYMPHS PCT: 35.2 % (ref 12.0–46.0)
MCHC: 34 g/dL (ref 30.0–36.0)
MCV: 93.7 fl (ref 78.0–100.0)
MONO ABS: 0.5 10*3/uL (ref 0.1–1.0)
MONOS PCT: 10.7 % (ref 3.0–12.0)
NEUTROS ABS: 2.1 10*3/uL (ref 1.4–7.7)
NEUTROS PCT: 50.4 % (ref 43.0–77.0)
PLATELETS: 209 10*3/uL (ref 150.0–400.0)
RBC: 4.49 Mil/uL (ref 3.87–5.11)
RDW: 13.2 % (ref 11.5–15.5)
WBC: 4.2 10*3/uL (ref 4.0–10.5)

## 2015-04-28 LAB — TSH: TSH: 7.91 u[IU]/mL — AB (ref 0.35–4.50)

## 2015-04-28 LAB — HEPATIC FUNCTION PANEL
ALBUMIN: 4.7 g/dL (ref 3.5–5.2)
ALK PHOS: 45 U/L (ref 39–117)
ALT: 15 U/L (ref 0–35)
AST: 16 U/L (ref 0–37)
BILIRUBIN TOTAL: 0.5 mg/dL (ref 0.2–1.2)
Bilirubin, Direct: 0.1 mg/dL (ref 0.0–0.3)
Total Protein: 7.3 g/dL (ref 6.0–8.3)

## 2015-04-28 LAB — LIPID PANEL
Cholesterol: 262 mg/dL — ABNORMAL HIGH (ref 0–200)
HDL: 68.3 mg/dL (ref >39.00)
LDL Cholesterol: 173 mg/dL — ABNORMAL HIGH (ref 0–99)
NONHDL: 193.36
TRIGLYCERIDES: 104 mg/dL (ref 0.0–149.0)
Total CHOL/HDL Ratio: 4
VLDL: 20.8 mg/dL (ref 0.0–40.0)

## 2015-04-28 LAB — BASIC METABOLIC PANEL
BUN: 11 mg/dL (ref 6–23)
CALCIUM: 10.3 mg/dL (ref 8.4–10.5)
CO2: 26 meq/L (ref 19–32)
CREATININE: 0.92 mg/dL (ref 0.40–1.20)
Chloride: 105 mEq/L (ref 96–112)
GFR: 66.87 mL/min (ref >60.00)
GLUCOSE: 72 mg/dL (ref 70–99)
Potassium: 5 mEq/L (ref 3.5–5.1)
SODIUM: 139 meq/L (ref 135–145)

## 2015-05-02 ENCOUNTER — Ambulatory Visit (INDEPENDENT_AMBULATORY_CARE_PROVIDER_SITE_OTHER): Payer: 59 | Admitting: Family Medicine

## 2015-05-02 VITALS — BP 100/70 | HR 79 | Temp 97.5°F | Ht 61.0 in | Wt 142.0 lb

## 2015-05-02 DIAGNOSIS — Z0000 Encounter for general adult medical examination without abnormal findings: Secondary | ICD-10-CM

## 2015-05-02 DIAGNOSIS — E038 Other specified hypothyroidism: Secondary | ICD-10-CM

## 2015-05-02 MED ORDER — SYNTHROID 88 MCG PO TABS: 88.0000 ug | ORAL_TABLET | Freq: Every day | ORAL | Status: DC

## 2015-05-02 NOTE — Progress Notes (Signed)
   Subjective:    Patient ID: Sally Brooks, female    DOB: 01-Oct-1960, 55 y.o.   MRN: [id number]  HPI Patient here for physical exam. She sees gynecologist regularly. She is due for repeat colonoscopy-and plans to schedule soon.. She had recent DEXA scan and mammogram which were reportedly normal.  She takes Synthroid 75 g once daily. Compliant with therapy. Takes medication appropriately. Nonsmoker. No specific complaints. Tetanus up-to-date.  Past Medical History  Diagnosis Date  . HYPOTHYROIDISM 01/20/2009  . FOLLICULITIS [id number]   No past surgical history on file.  reports that she quit smoking about 36 years ago. Her smoking use included Cigarettes. She has a 2.5 pack-year smoking history. She does not have any smokeless tobacco history on file. Her alcohol and drug histories are not on file. family history includes Cancer in her other. No Known Allergies    Review of Systems  Constitutional: Negative for fever, activity change, appetite change, fatigue and unexpected weight change.  HENT: Negative for ear pain, hearing loss, sore throat and trouble swallowing.   Eyes: Negative for visual disturbance.  Respiratory: Negative for cough and shortness of breath.   Cardiovascular: Negative for chest pain and palpitations.  Gastrointestinal: Negative for abdominal pain, diarrhea, constipation and blood in stool.  Endocrine: Negative for polydipsia and polyuria.  Genitourinary: Negative for dysuria and hematuria.  Musculoskeletal: Negative for myalgias, back pain and arthralgias.  Skin: Negative for rash.  Neurological: Negative for dizziness, syncope and headaches.  Hematological: Negative for adenopathy.  Psychiatric/Behavioral: Negative for confusion and dysphoric mood.       Objective:   Physical Exam  Constitutional: She is oriented to person, place, and time. She appears well-developed and well-nourished.  HENT:  Head: Normocephalic and atraumatic.  Eyes: EOM are  normal. Pupils are equal, round, and reactive to light.  Neck: Normal range of motion. Neck supple. No thyromegaly present.  Cardiovascular: Normal rate, regular rhythm and normal heart sounds.   No murmur heard. Pulmonary/Chest: Breath sounds normal. No respiratory distress. She has no wheezes. She has no rales.  Abdominal: Soft. Bowel sounds are normal. She exhibits no distension and no mass. There is no tenderness. There is no rebound and no guarding.  Genitourinary:  Per gyn  Musculoskeletal: Normal range of motion. She exhibits no edema.  Lymphadenopathy:    She has no cervical adenopathy.  Neurological: She is alert and oriented to person, place, and time. She displays normal reflexes. No cranial nerve deficit.  Skin: No rash noted.  Psychiatric: She has a normal mood and affect. Her behavior is normal. Judgment and thought content normal.          Assessment & Plan:  Physical exam. Labs reviewed. TSH is elevated. Titrate Synthroid 88 g once daily. Recheck TSH in 3 months. Continue regular weightbearing exercise such as walking. Continue daily calcium and vitamin D. Patient will schedule repeat colonoscopy

## 2015-05-02 NOTE — Patient Instructions (Signed)
Remember to get thyroid checked in 3-4 months.

## 2015-05-02 NOTE — Progress Notes (Signed)
Pre visit review using our clinic review tool, if applicable. No additional management support is needed unless otherwise documented below in the visit note. 

## 2015-05-31 ENCOUNTER — Other Ambulatory Visit: Payer: Self-pay | Admitting: Family Medicine

## 2015-06-25 ENCOUNTER — Other Ambulatory Visit: Payer: Self-pay | Admitting: Family Medicine

## 2015-07-24 ENCOUNTER — Other Ambulatory Visit (INDEPENDENT_AMBULATORY_CARE_PROVIDER_SITE_OTHER): Payer: 59

## 2015-07-24 DIAGNOSIS — E038 Other specified hypothyroidism: Secondary | ICD-10-CM | POA: Diagnosis not present

## 2015-07-24 LAB — TSH: TSH: 1.56 u[IU]/mL (ref 0.35–4.50)

## 2015-07-25 MED ORDER — SYNTHROID 88 MCG PO TABS: ORAL_TABLET | ORAL | Status: DC

## 2015-11-12 ENCOUNTER — Other Ambulatory Visit: Payer: Self-pay | Admitting: Family Medicine

## 2016-01-21 ENCOUNTER — Other Ambulatory Visit: Payer: Self-pay | Admitting: Family Medicine

## 2016-04-13 ENCOUNTER — Other Ambulatory Visit: Payer: Self-pay | Admitting: Family Medicine

## 2016-06-06 ENCOUNTER — Other Ambulatory Visit: Payer: Self-pay | Admitting: Family Medicine

## 2016-09-11 ENCOUNTER — Other Ambulatory Visit: Payer: Self-pay | Admitting: Family Medicine

## 2016-10-21 ENCOUNTER — Telehealth: Payer: Self-pay | Admitting: *Deleted

## 2016-10-21 NOTE — Telephone Encounter (Signed)
Refill once.  Needs follow up and repeat TSH.

## 2016-10-21 NOTE — Telephone Encounter (Signed)
[facility] requesting 90 day refill for:  Synthroid 0.88 mcg Desyrel 50 mg  Office visit 05/02/15 - Remember to get thyroid checked in 3-4 months.  Okay to fill?

## 2016-10-22 MED ORDER — SYNTHROID 88 MCG PO TABS: ORAL_TABLET | ORAL | 0 refills | Status: DC

## 2016-10-22 NOTE — Telephone Encounter (Signed)
rx sent

## 2016-10-25 ENCOUNTER — Telehealth: Payer: Self-pay | Admitting: Family Medicine

## 2016-10-25 NOTE — Telephone Encounter (Signed)
Refill OK but needs to set up follow up.

## 2016-10-25 NOTE — Telephone Encounter (Signed)
Patient states that the pharmacy reached out to her and stated that the below medications can only be refilled if OK'd by the provider.   SYNTHROID 88 MCG tablet     traZODone (DESYREL) 50 MG tablet

## 2016-10-25 NOTE — Telephone Encounter (Signed)
Last refill 10/21/16 - Refill once.  Needs follow up and repeat TSH. No appointment made.  Okay to refill?

## 2016-10-26 MED ORDER — TRAZODONE HCL 50 MG PO TABS: 50.0000 mg | ORAL_TABLET | Freq: Every evening | ORAL | 0 refills | Status: DC | PRN

## 2016-10-26 MED ORDER — SYNTHROID 88 MCG PO TABS: ORAL_TABLET | ORAL | 0 refills | Status: DC

## 2016-10-26 NOTE — Telephone Encounter (Signed)
Patient is aware and will call back to schedule an appointment.  30 day Rx sent.

## 2016-10-28 ENCOUNTER — Telehealth: Payer: Self-pay | Admitting: Family Medicine

## 2016-10-28 NOTE — Telephone Encounter (Signed)
Last Rx given on 9/11 for #30 with no ref to local [facility]

## 2016-10-28 NOTE — Telephone Encounter (Signed)
° ° ° ° °  Pt request refill of the following:   traZODone (DESYREL) 50 MG tablet  Pham[facility]:  [facility]

## 2016-10-29 NOTE — Telephone Encounter (Signed)
Needs office follow up.  Not seen in > one year.

## 2016-10-29 NOTE — Telephone Encounter (Signed)
I called the pt and informed her of the message below.  She stated she has an appt on 10/23.

## 2016-12-07 ENCOUNTER — Encounter: Payer: Self-pay | Admitting: Family Medicine

## 2016-12-07 ENCOUNTER — Ambulatory Visit (INDEPENDENT_AMBULATORY_CARE_PROVIDER_SITE_OTHER): Payer: 59 | Admitting: Family Medicine

## 2016-12-07 VITALS — BP 100/68 | HR 72 | Temp 97.5°F | Ht 61.0 in | Wt 129.9 lb

## 2016-12-07 DIAGNOSIS — E039 Hypothyroidism, unspecified: Secondary | ICD-10-CM | POA: Diagnosis not present

## 2016-12-07 LAB — TSH: TSH: 0.09 u[IU]/mL — AB (ref 0.35–4.50)

## 2016-12-07 NOTE — Progress Notes (Signed)
Subjective:     Patient ID: Sally Brooks, female   DOB: 1961-01-17, 56 y.o.   MRN: [id number]  HPI Patient seen for follow-up hypothyroidism. Takes Synthroid 88 g daily. Compliant with therapy. No labs in over a year. Generally feels well. She has lost about 13 pounds due to her efforts. She has cut back on sugars and starches and cheese intake.  Past Medical History:  Diagnosis Date  . FOLLICULITIS 00/05/5995  . HYPOTHYROIDISM 01/20/2009   No past surgical history on file.  reports that she quit smoking about 37 years ago. Her smoking use included Cigarettes. She has a 2.50 pack-year smoking history. She has never used smokeless tobacco. Her alcohol and drug histories are not on file. family history includes Cancer in her other. No Known Allergies   Review of Systems  Constitutional: Negative for fatigue.  Eyes: Negative for visual disturbance.  Respiratory: Negative for cough, chest tightness, shortness of breath and wheezing.   Cardiovascular: Negative for chest pain, palpitations and leg swelling.  Neurological: Negative for dizziness, seizures, syncope, weakness, light-headedness and headaches.       Objective:   Physical Exam  Constitutional: She appears well-developed and well-nourished.  Eyes: Pupils are equal, round, and reactive to light.  Neck: Neck supple. No JVD present. No thyromegaly present.  Cardiovascular: Normal rate and regular rhythm.  Exam reveals no gallop.   Pulmonary/Chest: Effort normal and breath sounds normal. No respiratory distress. She has no wheezes. She has no rales.  Musculoskeletal: She exhibits no edema.  Neurological: She is alert.       Assessment:     Hypothyroidism    Plan:     -Recheck labs with TSH -Flu vaccine offered and declined  Eulas Post MD  [facility] at [facility]

## 2016-12-08 ENCOUNTER — Telehealth: Payer: Self-pay | Admitting: Family Medicine

## 2016-12-08 ENCOUNTER — Other Ambulatory Visit: Payer: Self-pay | Admitting: Family Medicine

## 2016-12-08 DIAGNOSIS — E785 Hyperlipidemia, unspecified: Secondary | ICD-10-CM

## 2016-12-08 DIAGNOSIS — E039 Hypothyroidism, unspecified: Secondary | ICD-10-CM

## 2016-12-08 MED ORDER — LEVOTHYROXINE SODIUM 75 MCG PO TABS: 75.0000 ug | ORAL_TABLET | Freq: Every day | ORAL | 0 refills | Status: DC

## 2016-12-08 NOTE — Telephone Encounter (Signed)
Spoke with patient and Rx sent to mail order.  Patient will call local pharmacy and cancel the Rx.

## 2016-12-08 NOTE — Telephone Encounter (Signed)
Pt is calling to follow up on a Rx that was discussed earlier

## 2016-12-13 ENCOUNTER — Other Ambulatory Visit: Payer: Self-pay | Admitting: Family Medicine

## 2017-01-01 ENCOUNTER — Other Ambulatory Visit: Payer: Self-pay | Admitting: Family Medicine

## 2017-02-21 ENCOUNTER — Other Ambulatory Visit (INDEPENDENT_AMBULATORY_CARE_PROVIDER_SITE_OTHER): Payer: 59

## 2017-02-21 DIAGNOSIS — E039 Hypothyroidism, unspecified: Secondary | ICD-10-CM

## 2017-02-21 DIAGNOSIS — E785 Hyperlipidemia, unspecified: Secondary | ICD-10-CM | POA: Diagnosis not present

## 2017-02-21 LAB — LIPID PANEL
CHOL/HDL RATIO: 4
Cholesterol: 252 mg/dL — ABNORMAL HIGH (ref 0–200)
HDL: 56.2 mg/dL (ref >39.00)
LDL CALC: 169 mg/dL — AB (ref 0–99)
NONHDL: 195.69
Triglycerides: 131 mg/dL (ref 0.0–149.0)
VLDL: 26.2 mg/dL (ref 0.0–40.0)

## 2017-02-21 LAB — TSH: TSH: 1.35 u[IU]/mL (ref 0.35–4.50)

## 2017-03-18 ENCOUNTER — Other Ambulatory Visit: Payer: Self-pay | Admitting: Family Medicine

## 2017-03-21 NOTE — Telephone Encounter (Signed)
Refills OK. 

## 2017-03-31 DIAGNOSIS — M25531 Pain in right wrist: Secondary | ICD-10-CM | POA: Diagnosis not present

## 2017-03-31 DIAGNOSIS — M25571 Pain in right ankle and joints of right foot: Secondary | ICD-10-CM | POA: Diagnosis not present

## 2017-04-11 DIAGNOSIS — M9901 Segmental and somatic dysfunction of cervical region: Secondary | ICD-10-CM | POA: Diagnosis not present

## 2017-04-14 DIAGNOSIS — M79641 Pain in right hand: Secondary | ICD-10-CM | POA: Diagnosis not present

## 2017-04-14 DIAGNOSIS — M25571 Pain in right ankle and joints of right foot: Secondary | ICD-10-CM | POA: Diagnosis not present

## 2017-06-02 ENCOUNTER — Other Ambulatory Visit: Payer: Self-pay | Admitting: Family Medicine

## 2017-06-06 DIAGNOSIS — M9901 Segmental and somatic dysfunction of cervical region: Secondary | ICD-10-CM | POA: Diagnosis not present

## 2017-06-15 DIAGNOSIS — Z01419 Encounter for gynecological examination (general) (routine) without abnormal findings: Secondary | ICD-10-CM | POA: Diagnosis not present

## 2017-06-15 DIAGNOSIS — Z130 Encounter for screening for diseases of the blood and blood-forming organs and certain disorders involving the immune mechanism: Secondary | ICD-10-CM | POA: Diagnosis not present

## 2017-06-15 DIAGNOSIS — Z124 Encounter for screening for malignant neoplasm of cervix: Secondary | ICD-10-CM | POA: Diagnosis not present

## 2017-06-15 DIAGNOSIS — Z6823 Body mass index (BMI) 23.0-23.9, adult: Secondary | ICD-10-CM | POA: Diagnosis not present

## 2017-06-15 DIAGNOSIS — Z1231 Encounter for screening mammogram for malignant neoplasm of breast: Secondary | ICD-10-CM | POA: Diagnosis not present

## 2017-06-15 DIAGNOSIS — Z1389 Encounter for screening for other disorder: Secondary | ICD-10-CM | POA: Diagnosis not present

## 2017-06-15 LAB — HM PAP SMEAR: HM Pap smear: NORMAL

## 2017-06-20 ENCOUNTER — Encounter: Payer: Self-pay | Admitting: Family Medicine

## 2017-07-14 ENCOUNTER — Telehealth: Payer: Self-pay | Admitting: Family Medicine

## 2017-07-14 NOTE — Telephone Encounter (Signed)
Copied from [place] [id number]. Topic: Inquiry >> Jul 14, 2017  3:29 PM Pricilla Handler wrote: Reason for CRM: Patient called requesting refills of  SYNTHROID 75 MCG tablet and TraZODone (DESYREL) 50 MG tablet. [facility]'s [id number] - [place], [place] - [address] [phone] (Phone)  [phone] (Fax).           Thank You!!!

## 2017-07-14 NOTE — Telephone Encounter (Signed)
Copied from [place] [id number]. Topic: Inquiry >> Jul 14, 2017  3:29 PM Pricilla Handler wrote: Reason for CRM: Patient called requesting refills of  SYNTHROID 75 MCG tablet and TraZODone (DESYREL) 50 MG tablet. [facility]'s preferred pharmacy [id number] - [place], [place] - [address] [phone] (Phone)  [phone] (Fax).           Thank You!!!

## 2017-07-15 MED ORDER — LEVOTHYROXINE SODIUM 75 MCG PO TABS: 75.0000 ug | ORAL_TABLET | Freq: Every day | ORAL | 1 refills | Status: DC

## 2017-07-15 MED ORDER — TRAZODONE HCL 50 MG PO TABS: 50.0000 mg | ORAL_TABLET | Freq: Every evening | ORAL | 1 refills | Status: DC | PRN

## 2017-07-15 NOTE — Telephone Encounter (Signed)
See 2nd telephone encounter  

## 2017-07-15 NOTE — Telephone Encounter (Signed)
Refill for 6 months. 

## 2017-07-15 NOTE — Telephone Encounter (Signed)
Rx sent 

## 2018-01-08 ENCOUNTER — Other Ambulatory Visit: Payer: Self-pay | Admitting: Family Medicine

## 2018-01-09 ENCOUNTER — Encounter: Payer: 59 | Admitting: Family Medicine

## 2018-01-18 ENCOUNTER — Ambulatory Visit (INDEPENDENT_AMBULATORY_CARE_PROVIDER_SITE_OTHER): Payer: BLUE CROSS/BLUE SHIELD | Admitting: Family Medicine

## 2018-01-18 ENCOUNTER — Encounter: Payer: Self-pay | Admitting: Family Medicine

## 2018-01-18 VITALS — BP 110/78 | HR 75 | Temp 97.5°F | Wt 121.6 lb

## 2018-01-18 DIAGNOSIS — Z0000 Encounter for general adult medical examination without abnormal findings: Secondary | ICD-10-CM

## 2018-01-18 LAB — HEPATIC FUNCTION PANEL
ALT: 23 U/L (ref 0–35)
AST: 18 U/L (ref 0–37)
Albumin: 4.6 g/dL (ref 3.5–5.2)
Alkaline Phosphatase: 54 U/L (ref 39–117)
Bilirubin, Direct: 0.1 mg/dL (ref 0.0–0.3)
Total Bilirubin: 0.7 mg/dL (ref 0.2–1.2)
Total Protein: 6.7 g/dL (ref 6.0–8.3)

## 2018-01-18 LAB — CBC WITH DIFFERENTIAL/PLATELET
BASOS ABS: 0 10*3/uL (ref 0.0–0.1)
Basophils Relative: 0.8 % (ref 0.0–3.0)
Eosinophils Absolute: 0.2 10*3/uL (ref 0.0–0.7)
Eosinophils Relative: 4.7 % (ref 0.0–5.0)
HEMATOCRIT: 40.7 % (ref 36.0–46.0)
HEMOGLOBIN: 13.8 g/dL (ref 12.0–15.0)
LYMPHS PCT: 30.8 % (ref 12.0–46.0)
Lymphs Abs: 1.2 10*3/uL (ref 0.7–4.0)
MCHC: 33.9 g/dL (ref 30.0–36.0)
MCV: 97.5 fl (ref 78.0–100.0)
MONOS PCT: 13.6 % — AB (ref 3.0–12.0)
Monocytes Absolute: 0.5 10*3/uL (ref 0.1–1.0)
NEUTROS ABS: 1.9 10*3/uL (ref 1.4–7.7)
Neutrophils Relative %: 50.1 % (ref 43.0–77.0)
Platelets: 194 10*3/uL (ref 150.0–400.0)
RBC: 4.18 Mil/uL (ref 3.87–5.11)
RDW: 12.9 % (ref 11.5–15.5)
WBC: 3.8 10*3/uL — AB (ref 4.0–10.5)

## 2018-01-18 LAB — LIPID PANEL
CHOL/HDL RATIO: 3
Cholesterol: 258 mg/dL — ABNORMAL HIGH (ref 0–200)
HDL: 76.6 mg/dL (ref >39.00)
LDL Cholesterol: 168 mg/dL — ABNORMAL HIGH (ref 0–99)
NONHDL: 181.6
TRIGLYCERIDES: 68 mg/dL (ref 0.0–149.0)
VLDL: 13.6 mg/dL (ref 0.0–40.0)

## 2018-01-18 LAB — BASIC METABOLIC PANEL
BUN: 8 mg/dL (ref 6–23)
CALCIUM: 9.4 mg/dL (ref 8.4–10.5)
CO2: 27 mEq/L (ref 19–32)
CREATININE: 0.78 mg/dL (ref 0.40–1.20)
Chloride: 101 mEq/L (ref 96–112)
GFR: 80.14 mL/min (ref >60.00)
Glucose, Bld: 81 mg/dL (ref 70–99)
Potassium: 4.9 mEq/L (ref 3.5–5.1)
Sodium: 136 mEq/L (ref 135–145)

## 2018-01-18 LAB — TSH: TSH: 3.25 u[IU]/mL (ref 0.35–4.50)

## 2018-01-18 MED ORDER — SYNTHROID 150 MCG PO TABS: ORAL_TABLET | ORAL | 3 refills | Status: DC

## 2018-01-18 MED ORDER — TRAZODONE HCL 50 MG PO TABS: 50.0000 mg | ORAL_TABLET | Freq: Every evening | ORAL | 3 refills | Status: DC | PRN

## 2018-01-18 NOTE — Patient Instructions (Addendum)
We will set up repeat colonoscopy.    Consider Shingrix vaccine - check on insurance coverage.

## 2018-01-18 NOTE — Addendum Note (Signed)
Addended by: Eulas Post on: 01/18/2018 08:40 PM   Modules accepted: Orders

## 2018-01-18 NOTE — Progress Notes (Signed)
  Subjective:     Patient ID: Sally Brooks, female   DOB: 08/03/1960, 57 y.o.   MRN: [id number]  HPI Patient seen for physical exam.  She sees gynecologist yearly.  She is due for repeat colonoscopy this year.  She would like to go ahead and get that scheduled.  Has never smoked.  She has hypothyroidism and is currently on Synthroid 75 mcg daily.  She prefers brand name only.  She takes trazodone 50 mg at night for insomnia.  Tetanus up-to-date.  Declines flu vaccine.  She has done excellent job with weight loss during the past year by restricting overall calories.  Still exercises regularly with walking.  Past Medical History:  Diagnosis Date  . FOLLICULITIS 01/22/7866  . HYPOTHYROIDISM 01/20/2009   No past surgical history on file.  reports that she quit smoking about 39 years ago. Her smoking use included cigarettes. She has a 2.50 pack-year smoking history. She has never used smokeless tobacco. Her alcohol and drug histories are not on file. family history includes Cancer in her other. No Known Allergies   Review of Systems  Constitutional: Negative for activity change, appetite change, fatigue, fever and unexpected weight change.  HENT: Negative for ear pain, hearing loss, sore throat and trouble swallowing.   Eyes: Negative for visual disturbance.  Respiratory: Negative for cough and shortness of breath.   Cardiovascular: Negative for chest pain and palpitations.  Gastrointestinal: Negative for abdominal pain, blood in stool, constipation and diarrhea.  Genitourinary: Negative for dysuria and hematuria.  Musculoskeletal: Negative for arthralgias, back pain and myalgias.  Skin: Negative for rash.  Neurological: Negative for dizziness, syncope and headaches.  Hematological: Negative for adenopathy.  Psychiatric/Behavioral: Negative for confusion and dysphoric mood.       Objective:   Physical Exam  Constitutional: She is oriented to person, place, and time. She appears  well-developed and well-nourished.  HENT:  Head: Normocephalic and atraumatic.  Eyes: Pupils are equal, round, and reactive to light. EOM are normal.  Neck: Normal range of motion. Neck supple. No thyromegaly present.  Cardiovascular: Normal rate, regular rhythm and normal heart sounds.  No murmur heard. Pulmonary/Chest: Breath sounds normal. No respiratory distress. She has no wheezes. She has no rales.  Abdominal: Soft. Bowel sounds are normal. She exhibits no distension and no mass. There is no tenderness. There is no rebound and no guarding.  Musculoskeletal: Normal range of motion. She exhibits no edema.  Lymphadenopathy:    She has no cervical adenopathy.  Neurological: She is alert and oriented to person, place, and time. She displays normal reflexes. No cranial nerve deficit.  Skin: No rash noted.  Psychiatric: She has a normal mood and affect. Her behavior is normal. Judgment and thought content normal.       Assessment:     Physical exam.  Healthy 57 year old female.  Hypothyroid history currently on replacement    Plan:     -Recheck labs -We will refill Synthroid after we get her labs back to determine if we need to make changes in dosage. -Set up GI referral for repeat colonoscopy -She will continue with yearly follow-up with GYN - discussed Shingrix and she will check on insurance coverage.  Eulas Post MD [place] [facility]

## 2018-01-20 ENCOUNTER — Telehealth: Payer: Self-pay | Admitting: *Deleted

## 2018-01-20 NOTE — Telephone Encounter (Signed)
Copied from [place] [id number]. Topic: General - Other >> Jan 18, 2018  1:40 PM Judyann Munson wrote: Reason for CRM: patient is calling to advise her insurance will cover her vacancies Shingrix. She is request a nurse visit appt to have this completed. Please advise >> Jan 20, 2018  9:22 AM Cox, Melburn Hake, CMA wrote: Please advise if ok for pt to receive Shingrix.

## 2018-01-23 NOTE — Telephone Encounter (Signed)
ok 

## 2018-01-23 NOTE — Telephone Encounter (Signed)
I called the pt and informed her of the message below.  Nurse visit appts scheduled for 03/01/2018 and 05/03/2018.

## 2018-01-25 ENCOUNTER — Encounter: Payer: Self-pay | Admitting: Gastroenterology

## 2018-02-22 ENCOUNTER — Other Ambulatory Visit: Payer: Self-pay

## 2018-02-22 ENCOUNTER — Ambulatory Visit (AMBULATORY_SURGERY_CENTER): Payer: Self-pay | Admitting: *Deleted

## 2018-02-22 VITALS — Ht 61.0 in | Wt 121.0 lb

## 2018-02-22 DIAGNOSIS — Z1211 Encounter for screening for malignant neoplasm of colon: Secondary | ICD-10-CM

## 2018-02-22 MED ORDER — SUPREP BOWEL PREP KIT 17.5-3.13-1.6 GM/177ML PO SOLN: 1.0000 | Freq: Once | ORAL | 0 refills | Status: AC

## 2018-02-22 NOTE — Progress Notes (Signed)
No egg or soy allergy known to patient  No issues with past sedation with any surgeries  or procedures, no intubation problems  No diet pills per patient No home 02 use per patient  No blood thinners per patient  Pt denies issues with constipation  No A fib or A flutter  EMMI video  Offered and declined by the patient. 

## 2018-03-01 ENCOUNTER — Encounter: Payer: Self-pay | Admitting: Gastroenterology

## 2018-03-01 ENCOUNTER — Ambulatory Visit (INDEPENDENT_AMBULATORY_CARE_PROVIDER_SITE_OTHER): Payer: BLUE CROSS/BLUE SHIELD

## 2018-03-01 DIAGNOSIS — Z23 Encounter for immunization: Secondary | ICD-10-CM

## 2018-03-01 NOTE — Progress Notes (Signed)
Per orders of Dr. Burchette, injection of Shingrix given by Arion Morgan. Patient tolerated injection well.  

## 2018-03-08 DIAGNOSIS — L814 Other melanin hyperpigmentation: Secondary | ICD-10-CM | POA: Diagnosis not present

## 2018-03-08 DIAGNOSIS — D225 Melanocytic nevi of trunk: Secondary | ICD-10-CM | POA: Diagnosis not present

## 2018-03-08 DIAGNOSIS — D2262 Melanocytic nevi of left upper limb, including shoulder: Secondary | ICD-10-CM | POA: Diagnosis not present

## 2018-03-08 DIAGNOSIS — D2261 Melanocytic nevi of right upper limb, including shoulder: Secondary | ICD-10-CM | POA: Diagnosis not present

## 2018-03-15 ENCOUNTER — Ambulatory Visit (AMBULATORY_SURGERY_CENTER): Payer: BLUE CROSS/BLUE SHIELD | Admitting: Gastroenterology

## 2018-03-15 ENCOUNTER — Encounter: Payer: Self-pay | Admitting: Gastroenterology

## 2018-03-15 VITALS — BP 110/50 | HR 54 | Temp 96.2°F | Resp 18 | Ht 61.0 in | Wt 121.0 lb

## 2018-03-15 DIAGNOSIS — Z1211 Encounter for screening for malignant neoplasm of colon: Secondary | ICD-10-CM

## 2018-03-15 DIAGNOSIS — D128 Benign neoplasm of rectum: Secondary | ICD-10-CM | POA: Diagnosis not present

## 2018-03-15 MED ORDER — SODIUM CHLORIDE 0.9 % IV SOLN: 500.0000 mL | Freq: Once | INTRAVENOUS | Status: DC

## 2018-03-15 NOTE — Progress Notes (Signed)
Pt's states no medical or surgical changes since previsit or office visit. 

## 2018-03-15 NOTE — Op Note (Signed)
[facility] Patient Name: Sally Brooks Procedure Date: 03/15/2018 9:07 AM MRN: [id number] Endoscopist: Mauri Pole , MD Age: 58 Referring MD:  Date of Birth: 07/23/1960 Gender: Female Account #: [id number] Procedure:                Colonoscopy Indications:              Screening for colorectal malignant neoplasm Medicines:                Monitored Anesthesia Care Procedure:                Pre-Anesthesia Assessment:                           - Prior to the procedure, a History and Physical                            was performed, and patient medications and                            allergies were reviewed. The patient's tolerance of                            previous anesthesia was also reviewed. The risks                            and benefits of the procedure and the sedation                            options and risks were discussed with the patient.                            All questions were answered, and informed consent                            was obtained. Prior Anticoagulants: The patient has                            taken no previous anticoagulant or antiplatelet                            agents. ASA Grade Assessment: II - A patient with                            mild systemic disease. After reviewing the risks                            and benefits, the patient was deemed in                            satisfactory condition to undergo the procedure.                           After obtaining informed consent, the colonoscope  was passed under direct vision. Throughout the                            procedure, the patient's blood pressure, pulse, and                            oxygen saturations were monitored continuously. The                            Colonoscope was introduced through the anus and                            advanced to the the cecum, identified by                            appendiceal orifice and  ileocecal valve. The                            colonoscopy was performed without difficulty. The                            patient tolerated the procedure well. The quality                            of the bowel preparation was excellent. The                            ileocecal valve, appendiceal orifice, and rectum                            were photographed. Scope In: 9:10:51 AM Scope Out: 9:28:03 AM Scope Withdrawal Time: 0 hours 13 minutes 53 seconds  Total Procedure Duration: 0 hours 17 minutes 12 seconds  Findings:                 The perianal and digital rectal examinations were                            normal.                           A 4 mm polyp was found in the rectum. The polyp was                            sessile. The polyp was removed with a cold snare.                            Resection and retrieval were complete.                           Non-bleeding internal hemorrhoids were found during                            retroflexion. The hemorrhoids were small. Complications:  No immediate complications. Estimated Blood Loss:     Estimated blood loss was minimal. Impression:               - One 4 mm polyp in the rectum, removed with a cold                            snare. Resected and retrieved.                           - Non-bleeding internal hemorrhoids. Recommendation:           - Patient has a contact number available for                            emergencies. The signs and symptoms of potential                            delayed complications were discussed with the                            patient. Return to normal activities tomorrow.                            Written discharge instructions were provided to the                            patient.                           - Resume previous diet.                           - Continue present medications.                           - Await pathology results.                           - Repeat  colonoscopy in 5-10 years for surveillance                            based on pathology results. Mauri Pole, MD 03/15/2018 9:33:37 AM This report has been signed electronically.

## 2018-03-15 NOTE — Progress Notes (Signed)
Report to PACU, RN, vss, BBS= Clear.  

## 2018-03-15 NOTE — Progress Notes (Signed)
Called to room to assist during endoscopic procedure.  Patient ID and intended procedure confirmed with present staff. Received instructions for my participation in the procedure from the performing physician.  

## 2018-03-15 NOTE — Patient Instructions (Signed)
Information on polyps and hemorrhoids given to you today.  Await pathology results.   YOU HAD AN ENDOSCOPIC PROCEDURE TODAY AT THE [facility]:   Refer to the procedure report that was given to you for any specific questions about what was found during the examination.  If the procedure report does not answer your questions, please call your gastroenterologist to clarify.  If you requested that your care partner not be given the details of your procedure findings, then the procedure report has been included in a sealed envelope for you to review at your convenience later.  YOU SHOULD EXPECT: Some feelings of bloating in the abdomen. Passage of more gas than usual.  Walking can help get rid of the air that was put into your GI tract during the procedure and reduce the bloating. If you had a lower endoscopy (such as a colonoscopy or flexible sigmoidoscopy) you may notice spotting of blood in your stool or on the toilet paper. If you underwent a bowel prep for your procedure, you may not have a normal bowel movement for a few days.  Please Note:  You might notice some irritation and congestion in your nose or some drainage.  This is from the oxygen used during your procedure.  There is no need for concern and it should clear up in a day or so.  SYMPTOMS TO REPORT IMMEDIATELY:   Following lower endoscopy (colonoscopy or flexible sigmoidoscopy):  Excessive amounts of blood in the stool  Significant tenderness or worsening of abdominal pains  Swelling of the abdomen that is new, acute  Fever of 100F or higher   For urgent or emergent issues, a gastroenterologist can be reached at any hour by calling [phone].   DIET:  We do recommend a small meal at first, but then you may proceed to your regular diet.  Drink plenty of fluids but you should avoid alcoholic beverages for 24 hours.  ACTIVITY:  You should plan to take it easy for the rest of today and you should NOT DRIVE or use  heavy machinery until tomorrow (because of the sedation medicines used during the test).    FOLLOW UP: Our staff will call the number listed on your records the next business day following your procedure to check on you and address any questions or concerns that you may have regarding the information given to you following your procedure. If we do not reach you, we will leave a message.  However, if you are feeling well and you are not experiencing any problems, there is no need to return our call.  We will assume that you have returned to your regular daily activities without incident.  If any biopsies were taken you will be contacted by phone or by letter within the next 1-3 weeks.  Please call us at [phone] if you have not heard about the biopsies in 3 weeks.    SIGNATURES/CONFIDENTIALITY: You and/or your care partner have signed paperwork which will be entered into your electronic medical record.  These signatures attest to the fact that that the information above on your After Visit Summary has been reviewed and is understood.  Full responsibility of the confidentiality of this discharge information lies with you and/or your care-partner. 

## 2018-03-16 ENCOUNTER — Telehealth: Payer: Self-pay

## 2018-03-16 NOTE — Telephone Encounter (Signed)
  Follow up Call-  Call back number 03/15/2018  Post procedure Call Back phone  # [phone]  Permission to leave phone message Yes  Some recent data might be hidden     Patient questions:  Do you have a fever, pain , or abdominal swelling? No. Pain Score  0 *  Have you tolerated food without any problems? Yes.    Have you been able to return to your normal activities? Yes.    Do you have any questions about your discharge instructions: Diet   No. Medications  No. Follow up visit  No.  Do you have questions or concerns about your Care? No.  Actions: * If pain score is 4 or above: No action needed, pain <4.

## 2018-03-20 ENCOUNTER — Encounter: Payer: Self-pay | Admitting: Gastroenterology

## 2018-05-03 ENCOUNTER — Other Ambulatory Visit: Payer: Self-pay

## 2018-05-03 ENCOUNTER — Ambulatory Visit (INDEPENDENT_AMBULATORY_CARE_PROVIDER_SITE_OTHER): Payer: BLUE CROSS/BLUE SHIELD | Admitting: *Deleted

## 2018-05-03 DIAGNOSIS — Z23 Encounter for immunization: Secondary | ICD-10-CM

## 2018-07-06 DIAGNOSIS — Z01419 Encounter for gynecological examination (general) (routine) without abnormal findings: Secondary | ICD-10-CM | POA: Diagnosis not present

## 2018-07-06 DIAGNOSIS — Z130 Encounter for screening for diseases of the blood and blood-forming organs and certain disorders involving the immune mechanism: Secondary | ICD-10-CM | POA: Diagnosis not present

## 2018-07-06 DIAGNOSIS — Z6821 Body mass index (BMI) 21.0-21.9, adult: Secondary | ICD-10-CM | POA: Diagnosis not present

## 2018-07-06 DIAGNOSIS — Z1231 Encounter for screening mammogram for malignant neoplasm of breast: Secondary | ICD-10-CM | POA: Diagnosis not present

## 2018-07-06 DIAGNOSIS — Z124 Encounter for screening for malignant neoplasm of cervix: Secondary | ICD-10-CM | POA: Diagnosis not present

## 2018-10-21 ENCOUNTER — Other Ambulatory Visit: Payer: Self-pay | Admitting: Family Medicine

## 2019-01-25 ENCOUNTER — Other Ambulatory Visit: Payer: Self-pay | Admitting: Family Medicine

## 2019-02-19 ENCOUNTER — Other Ambulatory Visit: Payer: Self-pay | Admitting: Family Medicine

## 2019-02-26 ENCOUNTER — Other Ambulatory Visit: Payer: Self-pay

## 2019-02-27 ENCOUNTER — Encounter: Payer: Self-pay | Admitting: Family Medicine

## 2019-02-27 ENCOUNTER — Ambulatory Visit (INDEPENDENT_AMBULATORY_CARE_PROVIDER_SITE_OTHER): Payer: BC Managed Care – PPO | Admitting: Family Medicine

## 2019-02-27 ENCOUNTER — Other Ambulatory Visit: Payer: Self-pay

## 2019-02-27 VITALS — BP 114/68 | HR 71 | Temp 97.3°F | Ht 60.75 in | Wt 112.5 lb

## 2019-02-27 DIAGNOSIS — E039 Hypothyroidism, unspecified: Secondary | ICD-10-CM | POA: Diagnosis not present

## 2019-02-27 DIAGNOSIS — G4700 Insomnia, unspecified: Secondary | ICD-10-CM

## 2019-02-27 MED ORDER — SYNTHROID 150 MCG PO TABS: ORAL_TABLET | ORAL | 3 refills | Status: DC

## 2019-02-27 MED ORDER — TRAZODONE HCL 50 MG PO TABS: 50.0000 mg | ORAL_TABLET | Freq: Every evening | ORAL | 3 refills | Status: DC | PRN

## 2019-02-27 NOTE — Patient Instructions (Signed)

## 2019-02-27 NOTE — Progress Notes (Signed)
  Subjective:     Patient ID: Sally Brooks, female   DOB: 1960/12/19, 59 y.o.   MRN: [id number]  HPI Sally Brooks is seen for follow-up regarding a couple of chronic medical issues as below  Hypothyroidism.  She takes Synthroid 150 mcg 1/2 tablet daily and has been fairly stable for several years.  She is compliant with therapy.  Her last labs were over a year ago.  She does see gynecologist and saw her gynecologist last May for Pap smears.  She had hemoglobin and urinalysis them but no thyroid functions were done at that point.  She has lost some weight during the past couple years and is maintained this through lifestyle changes mostly dietary change.  She walks frequently for exercise  Chronic insomnia.  She takes trazodone 50 mg most nights.  No regular alcohol use.  No late the use of caffeine.  She knows to avoid bright lights at night.  Trazodone usually works well for her.  Past Medical History:  Diagnosis Date  . FOLLICULITIS [id number]  . HYPOTHYROIDISM 01/20/2009   Past Surgical History:  Procedure Laterality Date  . BREAST REDUCTION SURGERY      reports that she quit smoking about 40 years ago. Her smoking use included cigarettes. She has a 2.50 pack-year smoking history. She has never used smokeless tobacco. She reports current alcohol use of about 14.0 standard drinks of alcohol per week. She reports that she does not use drugs. family history includes Cancer in an other family member. No Known Allergies  Wt Readings from Last 3 Encounters:  02/27/19 112 lb 8 oz (51 kg)  03/15/18 121 lb (54.9 kg)  02/22/18 121 lb (54.9 kg)     Review of Systems  Constitutional: Negative for appetite change, chills, fever and unexpected weight change.  Respiratory: Negative for shortness of breath.   Cardiovascular: Negative for chest pain.  Endocrine: Negative for cold intolerance and heat intolerance.  Psychiatric/Behavioral: Positive for sleep disturbance. Negative for dysphoric mood.        Objective:   Physical Exam Vitals reviewed.  Constitutional:      Appearance: Normal appearance.  Cardiovascular:     Rate and Rhythm: Normal rate and regular rhythm.  Pulmonary:     Effort: Pulmonary effort is normal.     Breath sounds: Normal breath sounds.  Musculoskeletal:     Cervical back: Neck supple.     Right lower leg: No edema.     Left lower leg: No edema.  Lymphadenopathy:     Cervical: No cervical adenopathy.  Neurological:     Mental Status: She is alert.  Psychiatric:        Mood and Affect: Mood normal.        Assessment:     #1 hypothyroidism.  Patient on replacement as above in need of follow-up labs  #2 chronic insomnia-stable on trazodone    Plan:     -Order labs with TSH -Refill Synthroid name brand only 150 mcg 1/2 tablet daily -Refill trazodone 50 mg nightly for as needed use -Discussed importance of regular weightbearing exercise.  -Follow-up in 1 year and sooner as needed  Eulas Post MD [facility]

## 2019-02-28 LAB — TSH: TSH: 5.26 u[IU]/mL — ABNORMAL HIGH (ref 0.35–4.50)

## 2019-03-01 NOTE — Addendum Note (Signed)
Addended by: Agnes Lawrence on: 03/01/2019 03:06 PM   Modules accepted: Orders

## 2019-04-03 DIAGNOSIS — Z03818 Encounter for observation for suspected exposure to other biological agents ruled out: Secondary | ICD-10-CM | POA: Diagnosis not present

## 2019-04-03 DIAGNOSIS — Z20828 Contact with and (suspected) exposure to other viral communicable diseases: Secondary | ICD-10-CM | POA: Diagnosis not present

## 2019-04-10 DIAGNOSIS — Z20828 Contact with and (suspected) exposure to other viral communicable diseases: Secondary | ICD-10-CM | POA: Diagnosis not present

## 2019-04-10 DIAGNOSIS — Z03818 Encounter for observation for suspected exposure to other biological agents ruled out: Secondary | ICD-10-CM | POA: Diagnosis not present

## 2019-04-17 ENCOUNTER — Telehealth: Payer: Self-pay | Admitting: Family Medicine

## 2019-04-17 NOTE — Telephone Encounter (Signed)
Pt has a lab visit on 4/15 for TSH and  T4, Free she would like a full panel of blood work done at this time if possible. Patient would like a call back to confirm.

## 2019-04-17 NOTE — Telephone Encounter (Signed)
We can do whichever labs she would like but unless this is tied with a diagnostic code such as physical exam insurance may not cover.  Usually get BMP, Lipid, hepatic, and CBC with physicals.  OK to order these

## 2019-04-17 NOTE — Telephone Encounter (Signed)
Please advise 

## 2019-04-23 NOTE — Telephone Encounter (Signed)
Patient notified and stated that she would just wait to have those labs done when she has her physical in May. Patient stated that she would just keep her lab appointment on 05/31/19 to check thyroid.

## 2019-04-24 ENCOUNTER — Telehealth: Payer: Self-pay | Admitting: Family Medicine

## 2019-04-24 NOTE — Telephone Encounter (Signed)
Sending as FYI 

## 2019-04-24 NOTE — Telephone Encounter (Signed)
Pt spoke with Triage nurse and was advised to be seen within 4 hours. Made pt aware she could go to an urgent care or the hospital because the physicians are booked for today.

## 2019-04-24 NOTE — Telephone Encounter (Signed)
Pt has been experiencing anxious feeling in her body & heart. Feeling  Faint and heart rate has been high 104 while just driving and [id number] while walking dog this morning.    Will send pt to triage nurse as well.

## 2019-04-27 DIAGNOSIS — Z6821 Body mass index (BMI) 21.0-21.9, adult: Secondary | ICD-10-CM | POA: Diagnosis not present

## 2019-04-27 DIAGNOSIS — Z01419 Encounter for gynecological examination (general) (routine) without abnormal findings: Secondary | ICD-10-CM | POA: Diagnosis not present

## 2019-04-27 DIAGNOSIS — Z124 Encounter for screening for malignant neoplasm of cervix: Secondary | ICD-10-CM | POA: Diagnosis not present

## 2019-04-27 DIAGNOSIS — Z130 Encounter for screening for diseases of the blood and blood-forming organs and certain disorders involving the immune mechanism: Secondary | ICD-10-CM | POA: Diagnosis not present

## 2019-05-23 DIAGNOSIS — D2262 Melanocytic nevi of left upper limb, including shoulder: Secondary | ICD-10-CM | POA: Diagnosis not present

## 2019-05-23 DIAGNOSIS — D2272 Melanocytic nevi of left lower limb, including hip: Secondary | ICD-10-CM | POA: Diagnosis not present

## 2019-05-23 DIAGNOSIS — D225 Melanocytic nevi of trunk: Secondary | ICD-10-CM | POA: Diagnosis not present

## 2019-05-23 DIAGNOSIS — D2271 Melanocytic nevi of right lower limb, including hip: Secondary | ICD-10-CM | POA: Diagnosis not present

## 2019-05-30 ENCOUNTER — Other Ambulatory Visit: Payer: Self-pay

## 2019-05-31 ENCOUNTER — Other Ambulatory Visit (INDEPENDENT_AMBULATORY_CARE_PROVIDER_SITE_OTHER): Payer: BC Managed Care – PPO

## 2019-05-31 DIAGNOSIS — E039 Hypothyroidism, unspecified: Secondary | ICD-10-CM

## 2019-05-31 LAB — TSH: TSH: 4.18 u[IU]/mL (ref 0.35–4.50)

## 2019-05-31 LAB — T4, FREE: Free T4: 1.03 ng/dL (ref 0.60–1.60)

## 2019-07-10 DIAGNOSIS — Z20822 Contact with and (suspected) exposure to covid-19: Secondary | ICD-10-CM | POA: Diagnosis not present

## 2019-07-10 DIAGNOSIS — Z6820 Body mass index (BMI) 20.0-20.9, adult: Secondary | ICD-10-CM | POA: Diagnosis not present

## 2019-07-10 DIAGNOSIS — Z1389 Encounter for screening for other disorder: Secondary | ICD-10-CM | POA: Diagnosis not present

## 2019-07-10 DIAGNOSIS — Z130 Encounter for screening for diseases of the blood and blood-forming organs and certain disorders involving the immune mechanism: Secondary | ICD-10-CM | POA: Diagnosis not present

## 2019-07-10 DIAGNOSIS — Z1231 Encounter for screening mammogram for malignant neoplasm of breast: Secondary | ICD-10-CM | POA: Diagnosis not present

## 2019-07-10 DIAGNOSIS — Z01419 Encounter for gynecological examination (general) (routine) without abnormal findings: Secondary | ICD-10-CM | POA: Diagnosis not present

## 2019-08-06 DIAGNOSIS — I83813 Varicose veins of bilateral lower extremities with pain: Secondary | ICD-10-CM | POA: Diagnosis not present

## 2019-08-09 DIAGNOSIS — I83813 Varicose veins of bilateral lower extremities with pain: Secondary | ICD-10-CM | POA: Diagnosis not present

## 2019-08-15 DIAGNOSIS — M5411 Radiculopathy, occipito-atlanto-axial region: Secondary | ICD-10-CM | POA: Diagnosis not present

## 2019-08-15 DIAGNOSIS — M9901 Segmental and somatic dysfunction of cervical region: Secondary | ICD-10-CM | POA: Diagnosis not present

## 2019-08-16 DIAGNOSIS — I83813 Varicose veins of bilateral lower extremities with pain: Secondary | ICD-10-CM | POA: Diagnosis not present

## 2019-08-22 DIAGNOSIS — M5411 Radiculopathy, occipito-atlanto-axial region: Secondary | ICD-10-CM | POA: Diagnosis not present

## 2019-08-22 DIAGNOSIS — M9901 Segmental and somatic dysfunction of cervical region: Secondary | ICD-10-CM | POA: Diagnosis not present

## 2019-09-26 DIAGNOSIS — M5411 Radiculopathy, occipito-atlanto-axial region: Secondary | ICD-10-CM | POA: Diagnosis not present

## 2019-09-26 DIAGNOSIS — M9901 Segmental and somatic dysfunction of cervical region: Secondary | ICD-10-CM | POA: Diagnosis not present

## 2019-11-06 DIAGNOSIS — M9901 Segmental and somatic dysfunction of cervical region: Secondary | ICD-10-CM | POA: Diagnosis not present

## 2019-11-06 DIAGNOSIS — M5411 Radiculopathy, occipito-atlanto-axial region: Secondary | ICD-10-CM | POA: Diagnosis not present

## 2019-11-16 DIAGNOSIS — I83813 Varicose veins of bilateral lower extremities with pain: Secondary | ICD-10-CM | POA: Diagnosis not present

## 2019-12-12 DIAGNOSIS — I87322 Chronic venous hypertension (idiopathic) with inflammation of left lower extremity: Secondary | ICD-10-CM | POA: Diagnosis not present

## 2019-12-12 DIAGNOSIS — I8312 Varicose veins of left lower extremity with inflammation: Secondary | ICD-10-CM | POA: Diagnosis not present

## 2019-12-12 DIAGNOSIS — I83812 Varicose veins of left lower extremities with pain: Secondary | ICD-10-CM | POA: Diagnosis not present

## 2019-12-14 DIAGNOSIS — I8312 Varicose veins of left lower extremity with inflammation: Secondary | ICD-10-CM | POA: Diagnosis not present

## 2019-12-19 DIAGNOSIS — I83812 Varicose veins of left lower extremities with pain: Secondary | ICD-10-CM | POA: Diagnosis not present

## 2019-12-19 DIAGNOSIS — I8312 Varicose veins of left lower extremity with inflammation: Secondary | ICD-10-CM | POA: Diagnosis not present

## 2020-01-21 DIAGNOSIS — I8312 Varicose veins of left lower extremity with inflammation: Secondary | ICD-10-CM | POA: Diagnosis not present

## 2020-01-31 DIAGNOSIS — M7981 Nontraumatic hematoma of soft tissue: Secondary | ICD-10-CM | POA: Diagnosis not present

## 2020-01-31 DIAGNOSIS — I8312 Varicose veins of left lower extremity with inflammation: Secondary | ICD-10-CM | POA: Diagnosis not present

## 2020-02-14 DIAGNOSIS — I8312 Varicose veins of left lower extremity with inflammation: Secondary | ICD-10-CM | POA: Diagnosis not present

## 2020-02-14 DIAGNOSIS — M7981 Nontraumatic hematoma of soft tissue: Secondary | ICD-10-CM | POA: Diagnosis not present

## 2020-02-19 ENCOUNTER — Other Ambulatory Visit: Payer: Self-pay | Admitting: Family Medicine

## 2020-04-09 ENCOUNTER — Other Ambulatory Visit: Payer: Self-pay | Admitting: Family Medicine

## 2020-05-13 ENCOUNTER — Other Ambulatory Visit: Payer: Self-pay | Admitting: Family Medicine

## 2020-06-02 ENCOUNTER — Telehealth: Payer: Self-pay | Admitting: Family Medicine

## 2020-06-02 NOTE — Telephone Encounter (Signed)
SYNTHROID 150 MCG tablet  traZODone (DESYREL) 50 MG tablet  [id number] - [place], [place] - [address] Phone:  [phone]  Fax:  [phone]

## 2020-06-02 NOTE — Telephone Encounter (Signed)
trazodone last filled 02/19/2020 Last OV 02/27/2019  Patient is due for a physical.  Ok to fill?

## 2020-06-03 MED ORDER — SYNTHROID 150 MCG PO TABS: ORAL_TABLET | ORAL | 0 refills | Status: DC

## 2020-06-03 MED ORDER — TRAZODONE HCL 50 MG PO TABS: 50.0000 mg | ORAL_TABLET | Freq: Every evening | ORAL | 0 refills | Status: DC | PRN

## 2020-06-03 NOTE — Addendum Note (Signed)
Addended by: Rebecca Eaton on: 06/03/2020 01:16 PM   Modules accepted: Orders

## 2020-06-03 NOTE — Telephone Encounter (Signed)
Prescriptions have been sent in. Patient has been made aware that her refills were sent in and that an appointment is needed. She stated she will call back to schedule an appointment.

## 2020-07-01 ENCOUNTER — Other Ambulatory Visit: Payer: Self-pay | Admitting: Family Medicine

## 2020-07-02 ENCOUNTER — Ambulatory Visit (INDEPENDENT_AMBULATORY_CARE_PROVIDER_SITE_OTHER): Payer: BC Managed Care – PPO | Admitting: Family Medicine

## 2020-07-02 ENCOUNTER — Other Ambulatory Visit: Payer: Self-pay

## 2020-07-02 ENCOUNTER — Encounter: Payer: Self-pay | Admitting: Family Medicine

## 2020-07-02 VITALS — BP 118/70 | HR 75 | Ht 60.75 in | Wt 111.5 lb

## 2020-07-02 DIAGNOSIS — Z23 Encounter for immunization: Secondary | ICD-10-CM

## 2020-07-02 DIAGNOSIS — Z0000 Encounter for general adult medical examination without abnormal findings: Secondary | ICD-10-CM | POA: Diagnosis not present

## 2020-07-02 DIAGNOSIS — R748 Abnormal levels of other serum enzymes: Secondary | ICD-10-CM

## 2020-07-02 DIAGNOSIS — E039 Hypothyroidism, unspecified: Secondary | ICD-10-CM

## 2020-07-02 LAB — BASIC METABOLIC PANEL
BUN: 9 mg/dL (ref 6–23)
CO2: 27 mEq/L (ref 19–32)
Calcium: 10 mg/dL (ref 8.4–10.5)
Chloride: 101 mEq/L (ref 96–112)
Creatinine, Ser: 0.78 mg/dL (ref 0.40–1.20)
GFR: 81.53 mL/min (ref >60.00)
Glucose, Bld: 89 mg/dL (ref 70–99)
Potassium: 5.1 mEq/L (ref 3.5–5.1)
Sodium: 136 mEq/L (ref 135–145)

## 2020-07-02 LAB — LIPID PANEL
Cholesterol: 266 mg/dL — ABNORMAL HIGH (ref 0–200)
HDL: 92.8 mg/dL (ref >39.00)
LDL Cholesterol: 145 mg/dL — ABNORMAL HIGH (ref 0–99)
NonHDL: 173.08
Total CHOL/HDL Ratio: 3
Triglycerides: 141 mg/dL (ref 0.0–149.0)
VLDL: 28.2 mg/dL (ref 0.0–40.0)

## 2020-07-02 LAB — CBC WITH DIFFERENTIAL/PLATELET
Basophils Absolute: 0 10*3/uL (ref 0.0–0.1)
Basophils Relative: 1 % (ref 0.0–3.0)
Eosinophils Absolute: 0.1 10*3/uL (ref 0.0–0.7)
Eosinophils Relative: 2.7 % (ref 0.0–5.0)
HCT: 40.2 % (ref 36.0–46.0)
Hemoglobin: 13.9 g/dL (ref 12.0–15.0)
Lymphocytes Relative: 27.5 % (ref 12.0–46.0)
Lymphs Abs: 1 10*3/uL (ref 0.7–4.0)
MCHC: 34.6 g/dL (ref 30.0–36.0)
MCV: 96.8 fl (ref 78.0–100.0)
Monocytes Absolute: 0.4 10*3/uL (ref 0.1–1.0)
Monocytes Relative: 10.1 % (ref 3.0–12.0)
Neutro Abs: 2.2 10*3/uL (ref 1.4–7.7)
Neutrophils Relative %: 58.7 % (ref 43.0–77.0)
Platelets: 195 10*3/uL (ref 150.0–400.0)
RBC: 4.15 Mil/uL (ref 3.87–5.11)
RDW: 12.8 % (ref 11.5–15.5)
WBC: 3.7 10*3/uL — ABNORMAL LOW (ref 4.0–10.5)

## 2020-07-02 LAB — HEPATIC FUNCTION PANEL
ALT: 37 U/L — ABNORMAL HIGH (ref 0–35)
AST: 45 U/L — ABNORMAL HIGH (ref 0–37)
Albumin: 4.8 g/dL (ref 3.5–5.2)
Alkaline Phosphatase: 53 U/L (ref 39–117)
Bilirubin, Direct: 0.1 mg/dL (ref 0.0–0.3)
Total Bilirubin: 0.8 mg/dL (ref 0.2–1.2)
Total Protein: 7 g/dL (ref 6.0–8.3)

## 2020-07-02 LAB — TSH: TSH: 5.49 u[IU]/mL — ABNORMAL HIGH (ref 0.35–4.50)

## 2020-07-02 NOTE — Progress Notes (Signed)
Established Patient Office Visit  Subjective:  Patient ID: Sally Brooks, female    DOB: [birth year]  Age: 60 y.o. MRN: [id number]  CC:  Chief Complaint  Patient presents with  . Annual Exam    HPI Sally Brooks presents for complete physical/well visit.  She does see gynecologist yearly and gets Pap smears and mammograms through them and these are up-to-date.  She does have history of hypothyroidism and is on replacement.  She has had some chronic insomnia and takes trazodone for that.  She has done tremendous job past few years with weight loss and weight maintenance.  She takes apple cider vinegar after her larger meals the day.  She does have osteopenia by previous DEXA scan 2017 with T score -1.7.  She is considering getting repeat through gynecologist this year.  Does take some calcium and vitamin D.  Health maintenance reviewed  -Pap smear and mammogram up-to-date -Patient declined COVID vaccination -She has had previous screening for hepatitis C antibody -Due for tetanus booster at this time -Has had previous Shingrix vaccine  Family history-mother is in her 51s and lives down in [place] and is in memory care.  Otherwise unrevealing  Social history-she is married.  She has couple of children.  5 grandchildren.  Quit smoking 1980.  Only smoked very briefly..    Past Medical History:  Diagnosis Date  . FOLLICULITIS 81/09/2991  . HYPOTHYROIDISM 01/20/2009    Past Surgical History:  Procedure Laterality Date  . BREAST REDUCTION SURGERY      Family History  Problem Relation Age of Onset  . Cancer Other        lung  . Colon cancer Neg Hx   . Colon polyps Neg Hx   . Esophageal cancer Neg Hx   . Stomach cancer Neg Hx   . Rectal cancer Neg Hx     Social History   Socioeconomic History  . Marital status: Married    Spouse name: Not on file  . Number of children: Not on file  . Years of education: Not on file  . Highest education level: Not on file   Occupational History  . Not on file  Tobacco Use  . Smoking status: Former Smoker    Packs/day: 0.50    Years: 5.00    Pack years: 2.50    Types: Cigarettes    Quit date: 12/18/1978    Years since quitting: 41.5  . Smokeless tobacco: Never Used  Vaping Use  . Vaping Use: Never used  Substance and Sexual Activity  . Alcohol use: Yes    Alcohol/week: 14.0 standard drinks    Types: 14 Glasses of wine per week  . Drug use: Never  . Sexual activity: Not on file  Other Topics Concern  . Not on file  Social History Narrative  . Not on file   Social Determinants of Health   Financial Resource Strain: Not on file  Food Insecurity: Not on file  Transportation Needs: Not on file  Physical Activity: Not on file  Stress: Not on file  Social Connections: Not on file  Intimate Partner Violence: Not on file    Outpatient Medications Prior to Visit  Medication Sig Dispense Refill  . Biotin 5000 MCG CAPS Take 2 capsules by mouth daily.    . Cyanocobalamin (VITAMIN B-12 PO) Take by mouth.    . SYNTHROID 150 MCG tablet TAKE 1/2 TABLET BY MOUTH EVERY DAY 15 tablet 0  . traZODone (DESYREL) 50 MG  tablet TAKE 1 TABLET (50 MG TOTAL) BY MOUTH AT BEDTIME AS NEEDED. FOR SLEEP 90 tablet 1  . UNABLE TO FIND c-salts buffered(powder vit c ) 2 tsp every am    . UNABLE TO FIND Med Name: Bioshield    . VITAMIN D PO Take by mouth.     No facility-administered medications prior to visit.    No Known Allergies  ROS Review of Systems  Constitutional: Negative for activity change, appetite change, fatigue, fever and unexpected weight change.  HENT: Negative for ear pain, hearing loss, sore throat and trouble swallowing.   Eyes: Negative for visual disturbance.  Respiratory: Negative for cough and shortness of breath.   Cardiovascular: Negative for chest pain and palpitations.  Gastrointestinal: Negative for abdominal pain, blood in stool, constipation and diarrhea.  Genitourinary: Negative for  dysuria and hematuria.  Musculoskeletal: Negative for arthralgias, back pain and myalgias.  Skin: Negative for rash.  Neurological: Negative for dizziness, syncope and headaches.  Hematological: Negative for adenopathy.  Psychiatric/Behavioral: Negative for confusion and dysphoric mood.      Objective:    Physical Exam Constitutional:      Appearance: She is well-developed.  HENT:     Head: Normocephalic and atraumatic.  Eyes:     Pupils: Pupils are equal, round, and reactive to light.  Neck:     Thyroid: No thyromegaly.  Cardiovascular:     Rate and Rhythm: Normal rate and regular rhythm.     Heart sounds: Normal heart sounds. No murmur heard.   Pulmonary:     Effort: No respiratory distress.     Breath sounds: Normal breath sounds. No wheezing or rales.  Abdominal:     General: Bowel sounds are normal. There is no distension.     Palpations: Abdomen is soft. There is no mass.     Tenderness: There is no abdominal tenderness. There is no guarding or rebound.  Musculoskeletal:        General: Normal range of motion.     Cervical back: Normal range of motion and neck supple.  Lymphadenopathy:     Cervical: No cervical adenopathy.  Skin:    Findings: No rash.  Neurological:     Mental Status: She is alert and oriented to person, place, and time.     Cranial Nerves: No cranial nerve deficit.     Deep Tendon Reflexes: Reflexes normal.  Psychiatric:        Behavior: Behavior normal.        Thought Content: Thought content normal.        Judgment: Judgment normal.     BP 118/70   Pulse 75   Ht 5' 0.75" (1.543 m)   Wt 111 lb 8 oz (50.6 kg)   SpO2 99%   BMI 21.24 kg/m  Wt Readings from Last 3 Encounters:  07/02/20 111 lb 8 oz (50.6 kg)  02/27/19 112 lb 8 oz (51 kg)  03/15/18 121 lb (54.9 kg)     Health Maintenance Due  Topic Date Due  . HIV Screening  Never done  . TETANUS/TDAP  01/31/2020    There are no preventive care reminders to display for this  patient.  Lab Results  Component Value Date   TSH 4.18 05/31/2019   Lab Results  Component Value Date   WBC 3.8 (L) 01/18/2018   HGB 13.8 01/18/2018   HCT 40.7 01/18/2018   MCV 97.5 01/18/2018   PLT 194.0 01/18/2018   Lab Results  Component Value Date  NA 136 01/18/2018   K 4.9 01/18/2018   CO2 27 01/18/2018   GLUCOSE 81 01/18/2018   BUN 8 01/18/2018   CREATININE 0.78 01/18/2018   BILITOT 0.7 01/18/2018   ALKPHOS 54 01/18/2018   AST 18 01/18/2018   ALT 23 01/18/2018   PROT 6.7 01/18/2018   ALBUMIN 4.6 01/18/2018   CALCIUM 9.4 01/18/2018   GFR 80.14 01/18/2018   Lab Results  Component Value Date   CHOL 258 (H) 01/18/2018   Lab Results  Component Value Date   HDL 76.60 01/18/2018   Lab Results  Component Value Date   LDLCALC 168 (H) 01/18/2018   Lab Results  Component Value Date   TRIG 68.0 01/18/2018   Lab Results  Component Value Date   CHOLHDL 3 01/18/2018   No results found for: HGBA1C    Assessment & Plan:   Problem List Items Addressed This Visit   None   Visit Diagnoses    Physical exam    -  Primary   Relevant Orders   Basic metabolic panel   Lipid panel   CBC with Differential/Platelet   TSH   Hepatic function panel    Generally healthy 60 year old female.  She has history of hypothyroidism and is on replacement.  Does need follow-up labs.  She has history of osteopenia by prior DEXA scan  -She plans to continue GYN follow-up for Pap smears and mammogram and also to get follow-up DEXA scan through GYN office  -Continue regular weightbearing exercise  -Obtain screening lab work.  -Tdap given  -Consider annual flu vaccine.  No orders of the defined types were placed in this encounter.   Follow-up: No follow-ups on file.    Carolann Littler, MD

## 2020-07-02 NOTE — Patient Instructions (Signed)
Preventive Care 84-62 Years Old, Female Preventive care refers to lifestyle choices and visits with your health care provider that can promote health and wellness. This includes:  A yearly physical exam. This is also called an annual wellness visit.  Regular dental and eye exams.  Immunizations.  Screening for certain conditions.  Healthy lifestyle choices, such as: ? Eating a healthy diet. ? Getting regular exercise. ? Not using drugs or products that contain nicotine and tobacco. ? Limiting alcohol use. What can I expect for my preventive care visit? Physical exam Your health care provider will check your:  Height and weight. These may be used to calculate your BMI (body mass index). BMI is a measurement that tells if you are at a healthy weight.  Heart rate and blood pressure.  Body temperature.  Skin for abnormal spots. Counseling Your health care provider may ask you questions about your:  Past medical problems.  Family's medical history.  Alcohol, tobacco, and drug use.  Emotional well-being.  Home life and relationship well-being.  Sexual activity.  Diet, exercise, and sleep habits.  Work and work Statistician.  Access to firearms.  Method of birth control.  Menstrual cycle.  Pregnancy history. What immunizations do I need? Vaccines are usually given at various ages, according to a schedule. Your health care provider will recommend vaccines for you based on your age, medical history, and lifestyle or other factors, such as travel or where you work.   What tests do I need? Blood tests  Lipid and cholesterol levels. These may be checked every 5 years, or more often if you are over 62 years old.  Hepatitis C test.  Hepatitis B test. Screening  Lung cancer screening. You may have this screening every year starting at age 62 if you have a 30-pack-year history of smoking and currently smoke or have quit within the past 15 years.  Colorectal cancer  screening. ? All adults should have this screening starting at age 62 and continuing until age 17. ? Your health care provider may recommend screening at age 62 if you are at increased risk. ? You will have tests every 1-10 years, depending on your results and the type of screening test.  Diabetes screening. ? This is done by checking your blood sugar (glucose) after you have not eaten for a while (fasting). ? You may have this done every 1-3 years.  Mammogram. ? This may be done every 1-2 years. ? Talk with your health care provider about when you should start having regular mammograms. This may depend on whether you have a family history of breast cancer.  BRCA-related cancer screening. This may be done if you have a family history of breast, ovarian, tubal, or peritoneal cancers.  Pelvic exam and Pap test. ? This may be done every 3 years starting at age 62. ? Starting at age 62, this may be done every 5 years if you have a Pap test in combination with an HPV test. Other tests  STD (sexually transmitted disease) testing, if you are at risk.  Bone density scan. This is done to screen for osteoporosis. You may have this scan if you are at high risk for osteoporosis. Talk with your health care provider about your test results, treatment options, and if necessary, the need for more tests. Follow these instructions at home: Eating and drinking  Eat a diet that includes fresh fruits and vegetables, whole grains, lean protein, and low-fat dairy products.  Take vitamin and mineral supplements  as recommended by your health care provider.  Do not drink alcohol if: ? Your health care provider tells you not to drink. ? You are pregnant, may be pregnant, or are planning to become pregnant.  If you drink alcohol: ? Limit how much you have to 0-1 drink a day. ? Be aware of how much alcohol is in your drink. In the [place] one drink equals one 12 oz bottle of beer (355 mL), one 5 oz glass of  wine (148 mL), or one 1 oz glass of hard liquor (44 mL).   Lifestyle  Take daily care of your teeth and gums. Brush your teeth every morning and night with fluoride toothpaste. Floss one time each day.  Stay active. Exercise for at least 30 minutes 5 or more days each week.  Do not use any products that contain nicotine or tobacco, such as cigarettes, e-cigarettes, and chewing tobacco. If you need help quitting, ask your health care provider.  Do not use drugs.  If you are sexually active, practice safe sex. Use a condom or other form of protection to prevent STIs (sexually transmitted infections).  If you do not wish to become pregnant, use a form of birth control. If you plan to become pregnant, see your health care provider for a prepregnancy visit.  If told by your health care provider, take low-dose aspirin daily starting at age 62.  Find healthy ways to cope with stress, such as: ? Meditation, yoga, or listening to music. ? Journaling. ? Talking to a trusted person. ? Spending time with friends and family. Safety  Always wear your seat belt while driving or riding in a vehicle.  Do not drive: ? If you have been drinking alcohol. Do not ride with someone who has been drinking. ? When you are tired or distracted. ? While texting.  Wear a helmet and other protective equipment during sports activities.  If you have firearms in your house, make sure you follow all gun safety procedures. What's next?  Visit your health care provider once a year for an annual wellness visit.  Ask your health care provider how often you should have your eyes and teeth checked.  Stay up to date on all vaccines. This information is not intended to replace advice given to you by your health care provider. Make sure you discuss any questions you have with your health care provider. Document Revised: 11/06/2019 Document Reviewed: 10/13/2017 Elsevier Patient Education  2021 Elsevier Inc.  

## 2020-07-04 ENCOUNTER — Other Ambulatory Visit: Payer: Self-pay | Admitting: Family Medicine

## 2020-07-04 MED ORDER — SYNTHROID 175 MCG PO TABS: 175.0000 ug | ORAL_TABLET | Freq: Every day | ORAL | 0 refills | Status: DC

## 2020-07-04 NOTE — Addendum Note (Signed)
Addended by: Agnes Lawrence on: 07/04/2020 03:59 PM   Modules accepted: Orders

## 2020-07-07 ENCOUNTER — Telehealth: Payer: Self-pay | Admitting: Family Medicine

## 2020-07-07 NOTE — Telephone Encounter (Signed)
Pt is calling needing clarification on her Rx Synthroid 175 MCG needing to know if she needs to 1/2 the medication or take 1 whole tablet b/c she was increased from 150 MG 1/2 tablet to now 175 MCG 1 tablet.  Pt would like to have a call back.

## 2020-07-08 MED ORDER — SYNTHROID 175 MCG PO TABS: ORAL_TABLET | ORAL | 0 refills | Status: DC

## 2020-07-08 NOTE — Addendum Note (Signed)
Addended by: Rebecca Eaton on: 07/08/2020 08:28 AM   Modules accepted: Orders

## 2020-07-08 NOTE — Telephone Encounter (Signed)
Spoke with the patient. She is aware to take 1/2 tablet. Chart has been updated.

## 2020-09-08 ENCOUNTER — Other Ambulatory Visit: Payer: BC Managed Care – PPO

## 2020-09-09 DIAGNOSIS — Z01419 Encounter for gynecological examination (general) (routine) without abnormal findings: Secondary | ICD-10-CM | POA: Diagnosis not present

## 2020-09-09 DIAGNOSIS — Z1231 Encounter for screening mammogram for malignant neoplasm of breast: Secondary | ICD-10-CM | POA: Diagnosis not present

## 2020-09-09 DIAGNOSIS — M8580 Other specified disorders of bone density and structure, unspecified site: Secondary | ICD-10-CM | POA: Diagnosis not present

## 2020-09-09 DIAGNOSIS — Z6820 Body mass index (BMI) 20.0-20.9, adult: Secondary | ICD-10-CM | POA: Diagnosis not present

## 2020-09-09 DIAGNOSIS — Z1151 Encounter for screening for human papillomavirus (HPV): Secondary | ICD-10-CM | POA: Diagnosis not present

## 2020-09-09 DIAGNOSIS — Z124 Encounter for screening for malignant neoplasm of cervix: Secondary | ICD-10-CM | POA: Diagnosis not present

## 2020-09-09 LAB — HM MAMMOGRAPHY

## 2020-09-10 ENCOUNTER — Other Ambulatory Visit (INDEPENDENT_AMBULATORY_CARE_PROVIDER_SITE_OTHER): Payer: BC Managed Care – PPO

## 2020-09-10 ENCOUNTER — Other Ambulatory Visit: Payer: Self-pay

## 2020-09-10 DIAGNOSIS — R748 Abnormal levels of other serum enzymes: Secondary | ICD-10-CM

## 2020-09-10 DIAGNOSIS — E039 Hypothyroidism, unspecified: Secondary | ICD-10-CM | POA: Diagnosis not present

## 2020-09-10 LAB — HEPATIC FUNCTION PANEL
ALT: 21 U/L (ref 0–35)
AST: 20 U/L (ref 0–37)
Albumin: 4.2 g/dL (ref 3.5–5.2)
Alkaline Phosphatase: 53 U/L (ref 39–117)
Bilirubin, Direct: 0.1 mg/dL (ref 0.0–0.3)
Total Bilirubin: 0.7 mg/dL (ref 0.2–1.2)
Total Protein: 6.4 g/dL (ref 6.0–8.3)

## 2020-09-10 LAB — TSH: TSH: 0.07 u[IU]/mL — ABNORMAL LOW (ref 0.35–5.50)

## 2020-09-12 ENCOUNTER — Other Ambulatory Visit: Payer: Self-pay | Admitting: Obstetrics and Gynecology

## 2020-09-12 DIAGNOSIS — M8580 Other specified disorders of bone density and structure, unspecified site: Secondary | ICD-10-CM

## 2020-09-12 MED ORDER — LEVOTHYROXINE SODIUM 75 MCG PO TABS: 75.0000 ug | ORAL_TABLET | Freq: Every day | ORAL | 0 refills | Status: DC

## 2020-09-12 NOTE — Progress Notes (Signed)
syn

## 2020-09-29 ENCOUNTER — Other Ambulatory Visit: Payer: Self-pay | Admitting: Family Medicine

## 2020-10-10 ENCOUNTER — Other Ambulatory Visit: Payer: Self-pay

## 2020-10-10 ENCOUNTER — Ambulatory Visit
Admission: RE | Admit: 2020-10-10 | Discharge: 2020-10-10 | Disposition: A | Discharge status: Home / Self Care | Payer: BC Managed Care – PPO | Source: Ambulatory Visit | Attending: Obstetrics and Gynecology | Admitting: Obstetrics and Gynecology

## 2020-10-10 DIAGNOSIS — M8589 Other specified disorders of bone density and structure, multiple sites: Secondary | ICD-10-CM | POA: Diagnosis not present

## 2020-10-10 DIAGNOSIS — M8580 Other specified disorders of bone density and structure, unspecified site: Secondary | ICD-10-CM

## 2020-12-25 ENCOUNTER — Other Ambulatory Visit: Payer: Self-pay | Admitting: Family Medicine

## 2020-12-25 ENCOUNTER — Telehealth: Payer: Self-pay

## 2020-12-25 MED ORDER — TRAZODONE HCL 50 MG PO TABS: 50.0000 mg | ORAL_TABLET | Freq: Every evening | ORAL | 1 refills | Status: DC | PRN

## 2020-12-25 NOTE — Addendum Note (Signed)
Addended by: Rebecca Eaton on: 12/25/2020 12:28 PM   Modules accepted: Orders

## 2020-12-25 NOTE — Telephone Encounter (Signed)
Rx sent in. Patient has been made aware.

## 2020-12-25 NOTE — Telephone Encounter (Signed)
Patient called requesting Rx refill traZODone (DESYREL) 50 MG tablet

## 2020-12-31 DIAGNOSIS — D1801 Hemangioma of skin and subcutaneous tissue: Secondary | ICD-10-CM | POA: Diagnosis not present

## 2020-12-31 DIAGNOSIS — L819 Disorder of pigmentation, unspecified: Secondary | ICD-10-CM | POA: Diagnosis not present

## 2020-12-31 DIAGNOSIS — D225 Melanocytic nevi of trunk: Secondary | ICD-10-CM | POA: Diagnosis not present

## 2020-12-31 DIAGNOSIS — L814 Other melanin hyperpigmentation: Secondary | ICD-10-CM | POA: Diagnosis not present

## 2021-03-11 ENCOUNTER — Other Ambulatory Visit: Payer: Self-pay | Admitting: Family Medicine

## 2021-05-04 ENCOUNTER — Ambulatory Visit (INDEPENDENT_AMBULATORY_CARE_PROVIDER_SITE_OTHER): Payer: 59 | Admitting: Family Medicine

## 2021-05-04 ENCOUNTER — Telehealth: Payer: Self-pay | Admitting: Family Medicine

## 2021-05-04 ENCOUNTER — Encounter: Payer: Self-pay | Admitting: Family Medicine

## 2021-05-04 VITALS — BP 100/70 | HR 73 | Temp 97.4°F | Ht 60.08 in | Wt 114.4 lb

## 2021-05-04 DIAGNOSIS — R7401 Elevation of levels of liver transaminase levels: Secondary | ICD-10-CM

## 2021-05-04 DIAGNOSIS — Z8379 Family history of other diseases of the digestive system: Secondary | ICD-10-CM | POA: Diagnosis not present

## 2021-05-04 DIAGNOSIS — E039 Hypothyroidism, unspecified: Secondary | ICD-10-CM

## 2021-05-04 LAB — HEPATIC FUNCTION PANEL
ALT: 24 U/L (ref 0–35)
AST: 30 U/L (ref 0–37)
Albumin: 4.8 g/dL (ref 3.5–5.2)
Alkaline Phosphatase: 56 U/L (ref 39–117)
Bilirubin, Direct: 0.1 mg/dL (ref 0.0–0.3)
Total Bilirubin: 0.8 mg/dL (ref 0.2–1.2)
Total Protein: 7.1 g/dL (ref 6.0–8.3)

## 2021-05-04 LAB — TSH: TSH: 5.35 u[IU]/mL (ref 0.35–5.50)

## 2021-05-04 LAB — T4, FREE: Free T4: 1.08 ng/dL (ref 0.60–1.60)

## 2021-05-04 NOTE — Progress Notes (Signed)
? ?Established Patient Office Visit ? ?Subjective:  ?Patient ID: Sally Brooks, female    DOB: 1960-07-02  Age: 61 y.o. MRN: [id number] ? ?CC:  ?Chief Complaint  ?Patient presents with  ? Labs Only  ? ? ?HPI ?Achilles Dunk presents for the following items ? ?She was to be screened for celiac disease.  She has a 47 year old grandson who was recently diagnosed after endoscopy.  He apparently had some anemia and other issues.  Patient has scaled back overall carbs but has not been gluten-free recently.  She denies any chronic diarrhea.  No history of anemia.  She is not aware of any other family history of other family members.  No recent abdominal pain.  Appetite and weight stable ? ?Hypothyroidism.  Last labs were last July.  Slightly over replaced with TSH 0.07.  We scaled back her thyroid slightly at that time. ? ?She had history of mild liver transaminase elevations with labs last summer.  AST 45 with ALT 37.  She does drink about 1 glass of wine per day and occasionally 2 but no history of binge drinking.  No abdominal pain.  No nausea or vomiting.  Does take some multivitamins but no significant herbal supplements.  Her only medications are Synthroid and trazodone. ? ?Past Medical History:  ?Diagnosis Date  ? FOLLICULITIS 82/10/9369  ? HYPOTHYROIDISM 01/20/2009  ? ? ?Past Surgical History:  ?Procedure Laterality Date  ? BREAST REDUCTION SURGERY    ? ? ?Family History  ?Problem Relation Age of Onset  ? Cancer Other   ?     lung  ? Colon cancer Neg Hx   ? Colon polyps Neg Hx   ? Esophageal cancer Neg Hx   ? Stomach cancer Neg Hx   ? Rectal cancer Neg Hx   ? ? ?Social History  ? ?Socioeconomic History  ? Marital status: Married  ?  Spouse name: Not on file  ? Number of children: Not on file  ? Years of education: Not on file  ? Highest education level: Not on file  ?Occupational History  ? Not on file  ?Tobacco Use  ? Smoking status: Former  ?  Packs/day: 0.50  ?  Years: 5.00  ?  Pack years: 2.50  ?  Types:  Cigarettes  ?  Quit date: 12/18/1978  ?  Years since quitting: 42.4  ? Smokeless tobacco: Never  ?Vaping Use  ? Vaping Use: Never used  ?Substance and Sexual Activity  ? Alcohol use: Yes  ?  Alcohol/week: 14.0 standard drinks  ?  Types: 14 Glasses of wine per week  ? Drug use: Never  ? Sexual activity: Not on file  ?Other Topics Concern  ? Not on file  ?Social History Narrative  ? Not on file  ? ?Social Determinants of Health  ? ?Financial Resource Strain: Not on file  ?Food Insecurity: Not on file  ?Transportation Needs: Not on file  ?Physical Activity: Not on file  ?Stress: Not on file  ?Social Connections: Not on file  ?Intimate Partner Violence: Not on file  ? ? ?Outpatient Medications Prior to Visit  ?Medication Sig Dispense Refill  ? Biotin 5000 MCG CAPS Take 2 capsules by mouth daily.    ? Cyanocobalamin (VITAMIN B-12 PO) Take by mouth.    ? SYNTHROID 75 MCG tablet TAKE 1 TABLET BY MOUTH DAILY BEFORE BREAKFAST. 90 tablet 0  ? traZODone (DESYREL) 50 MG tablet Take 1 tablet (50 mg total) by mouth at bedtime as  needed. for sleep 90 tablet 1  ? UNABLE TO FIND c-salts buffered(powder vit c ) 2 tsp every am    ? UNABLE TO FIND Med Name: Bioshield    ? VITAMIN D PO Take by mouth.    ? ?No facility-administered medications prior to visit.  ? ? ?No Known Allergies ? ?ROS ?Review of Systems  ?Constitutional:  Negative for chills and fever.  ?Respiratory:  Negative for cough and shortness of breath.   ?Gastrointestinal:  Negative for abdominal pain, diarrhea, nausea and vomiting.  ?Endocrine: Negative for cold intolerance and heat intolerance.  ? ?  ?Objective:  ?  ?Physical Exam ?Vitals reviewed.  ?Constitutional:   ?   Appearance: Normal appearance.  ?Cardiovascular:  ?   Rate and Rhythm: Normal rate and regular rhythm.  ?Pulmonary:  ?   Effort: Pulmonary effort is normal.  ?   Breath sounds: Normal breath sounds.  ?Neurological:  ?   Mental Status: She is alert.  ? ? ?BP 100/70 (BP Location: Left Arm, Patient  Position: Sitting, Cuff Size: Normal)   Pulse 73   Temp (!) 97.4 ?F (36.3 ?C) (Oral)   Ht 5' 0.08" (1.526 m)   Wt 114 lb 6.4 oz (51.9 kg)   SpO2 95%   BMI 22.29 kg/m?  ?Wt Readings from Last 3 Encounters:  ?05/04/21 114 lb 6.4 oz (51.9 kg)  ?07/02/20 111 lb 8 oz (50.6 kg)  ?02/27/19 112 lb 8 oz (51 kg)  ? ? ? ?Health Maintenance Due  ?Topic Date Due  ? HIV Screening  Never done  ? INFLUENZA VACCINE  Never done  ? ? ?There are no preventive care reminders to display for this patient. ? ?Lab Results  ?Component Value Date  ? TSH 0.07 (L) 09/10/2020  ? ?Lab Results  ?Component Value Date  ? WBC 3.7 (L) 07/02/2020  ? HGB 13.9 07/02/2020  ? HCT 40.2 07/02/2020  ? MCV 96.8 07/02/2020  ? PLT 195.0 07/02/2020  ? ?Lab Results  ?Component Value Date  ? NA 136 07/02/2020  ? K 5.1 07/02/2020  ? CO2 27 07/02/2020  ? GLUCOSE 89 07/02/2020  ? BUN 9 07/02/2020  ? CREATININE 0.78 07/02/2020  ? BILITOT 0.7 09/10/2020  ? ALKPHOS 53 09/10/2020  ? AST 20 09/10/2020  ? ALT 21 09/10/2020  ? PROT 6.4 09/10/2020  ? ALBUMIN 4.2 09/10/2020  ? CALCIUM 10.0 07/02/2020  ? GFR 81.53 07/02/2020  ? ?Lab Results  ?Component Value Date  ? CHOL 266 (H) 07/02/2020  ? ?Lab Results  ?Component Value Date  ? HDL 92.80 07/02/2020  ? ?Lab Results  ?Component Value Date  ? LDLCALC 145 (H) 07/02/2020  ? ?Lab Results  ?Component Value Date  ? TRIG 141.0 07/02/2020  ? ?Lab Results  ?Component Value Date  ? CHOLHDL 3 07/02/2020  ? ?No results found for: HGBA1C ? ?  ?Assessment & Plan:  ? ?#1 family history of celiac disease and a 104 year old grandson.  Patient requesting screening.  She does not have any history of anemia or other specific symptoms of concern.  We will check total IgA and tissue transglutaminase IgA.  If negative, no further screening as long as her IgA total is positive ? ?#2 hypothyroidism.  Slightly over replaced by most recent labs.  Recheck TSH and free T4 and adjust accordingly ? ?#3 history of mild elevated liver transaminases by  last labs.  Etiology unclear.  Patient asymptomatic.  No family history of hemochromatosis.  We will start by  rechecking hepatic panel.  She does drink some alcohol but does not sound like level that would likely explain elevated transaminases.  If remain up will need further evaluation. ? ? ?No orders of the defined types were placed in this encounter. ? ? ?Follow-up: No follow-ups on file.  ? ? ?Carolann Littler, MD ?

## 2021-05-04 NOTE — Telephone Encounter (Signed)
Pt is calling to let md know she is taking one capsules of lion's mane mushroom for memory loss. The recommended amount is 3 capsules with is 2100 mg. Pt forgot to mention to md this morning. Please advise ?

## 2021-05-05 LAB — TISSUE TRANSGLUTAMINASE, IGA: (tTG) Ab, IgA: <1 U/mL

## 2021-05-05 LAB — IGA: Immunoglobulin A: 78 mg/dL (ref 70–320)

## 2021-05-05 NOTE — Telephone Encounter (Signed)
Pt informed of the message and verbalized understanding  

## 2021-05-07 ENCOUNTER — Telehealth: Payer: Self-pay | Admitting: Family Medicine

## 2021-05-07 NOTE — Telephone Encounter (Signed)
Labs have been placed in front office for pickup and pt is aware ?

## 2021-05-07 NOTE — Telephone Encounter (Signed)
Pt is calling and does have the results of blood work and would like to pick up a copy along with the comments dr Elease Hashimoto said. Pt is aware md out of office today ?

## 2021-05-20 ENCOUNTER — Telehealth: Payer: Self-pay

## 2021-05-20 NOTE — Telephone Encounter (Signed)
Pt informed of the message and verbalized understanding  

## 2021-05-20 NOTE — Telephone Encounter (Signed)
--  Caller states her grandson was diagnosed with ?severe celiac and his Doctor is wanting for his family ?members to get tested, the caller got tested but is ?wanting to know if the gene is listed on her lab results. ?Denies any new or worsening symptoms. ? ?

## 2021-05-26 ENCOUNTER — Telehealth: Payer: Self-pay | Admitting: Family Medicine

## 2021-05-26 DIAGNOSIS — Z8379 Family history of other diseases of the digestive system: Secondary | ICD-10-CM

## 2021-05-26 NOTE — Telephone Encounter (Signed)
Pt is calling and her daughter and grandson tested positive for celiac disease and her grandson md would like pt to be tested for celiac disease and would like gene test DQ8 test to be perform ?

## 2021-05-31 NOTE — Telephone Encounter (Signed)
Her initial celiac screen was negative, but I did order lab test- as requested and she will need to set up the lab.   ?

## 2021-06-01 ENCOUNTER — Other Ambulatory Visit: Payer: Self-pay | Admitting: Family Medicine

## 2021-06-01 NOTE — Telephone Encounter (Signed)
Pt informed and scheduled for lab  ?

## 2021-06-03 ENCOUNTER — Other Ambulatory Visit (INDEPENDENT_AMBULATORY_CARE_PROVIDER_SITE_OTHER): Payer: 59

## 2021-06-03 DIAGNOSIS — Z8379 Family history of other diseases of the digestive system: Secondary | ICD-10-CM

## 2021-06-12 ENCOUNTER — Other Ambulatory Visit: Payer: Self-pay | Admitting: Family Medicine

## 2021-06-18 ENCOUNTER — Telehealth: Payer: Self-pay | Admitting: Family Medicine

## 2021-06-18 MED ORDER — LEVOTHYROXINE SODIUM 75 MCG PO TABS: ORAL_TABLET | ORAL | 2 refills | Status: DC

## 2021-06-18 MED ORDER — TRAZODONE HCL 50 MG PO TABS: 50.0000 mg | ORAL_TABLET | Freq: Every evening | ORAL | 0 refills | Status: DC | PRN

## 2021-06-18 NOTE — Telephone Encounter (Signed)
Pt would like her Rx to go to a new Pharm location which is the following: ? ?[facility] # [place], [place] Phone:  [phone]  ?Fax:  [phone]  ?  ? ?Please advise.  ?

## 2021-06-18 NOTE — Telephone Encounter (Signed)
Rx sent 

## 2021-06-20 LAB — CELIAC HLA RFLX TO ABS
DQ2 (DQA1 0501/0505,DQB1 02XX): NEGATIVE
DQ8 (DQA1 03XX, DQB1 0302): POSITIVE

## 2021-06-20 LAB — CELIAC AB TTG DGP TIGA
Antigliadin Abs, IgA: 3 units (ref 0–19)
Gliadin IgG: 2 units (ref 0–19)
IgA/Immunoglobulin A, Serum: 69 mg/dL — ABNORMAL LOW (ref 87–352)
Tissue Transglut Ab: <2 U/mL (ref 0–5)
Transglutaminase IgA: <2 U/mL (ref 0–3)

## 2021-08-19 ENCOUNTER — Telehealth: Payer: Self-pay

## 2021-08-19 NOTE — Telephone Encounter (Signed)
Last mammogram as noted in GYN notes in media on 07/24/19 was 07/10/19. Will request most updated records at this time.

## 2021-08-25 ENCOUNTER — Encounter: Payer: Self-pay | Admitting: Family Medicine

## 2021-08-28 ENCOUNTER — Other Ambulatory Visit: Payer: Self-pay | Admitting: Family Medicine

## 2021-12-03 ENCOUNTER — Other Ambulatory Visit: Payer: Self-pay | Admitting: Family Medicine

## 2022-04-28 ENCOUNTER — Other Ambulatory Visit: Payer: Self-pay | Admitting: Family Medicine

## 2022-06-23 ENCOUNTER — Other Ambulatory Visit: Payer: Self-pay | Admitting: Family Medicine

## 2022-07-18 ENCOUNTER — Other Ambulatory Visit: Payer: Self-pay | Admitting: Family Medicine

## 2022-07-25 ENCOUNTER — Other Ambulatory Visit: Payer: Self-pay | Admitting: Family Medicine

## 2022-08-10 ENCOUNTER — Ambulatory Visit (INDEPENDENT_AMBULATORY_CARE_PROVIDER_SITE_OTHER): Payer: 59 | Admitting: Family Medicine

## 2022-08-10 ENCOUNTER — Encounter: Payer: Self-pay | Admitting: Family Medicine

## 2022-08-10 VITALS — BP 110/66 | HR 68 | Temp 98.0°F | Ht 60.0 in | Wt 110.2 lb

## 2022-08-10 DIAGNOSIS — E039 Hypothyroidism, unspecified: Secondary | ICD-10-CM | POA: Diagnosis not present

## 2022-08-10 LAB — TSH: TSH: 7.32 u[IU]/mL — ABNORMAL HIGH (ref 0.35–5.50)

## 2022-08-10 MED ORDER — LEVOTHYROXINE SODIUM 88 MCG PO TABS: 88.0000 ug | ORAL_TABLET | Freq: Every day | ORAL | 0 refills | Status: DC

## 2022-08-10 NOTE — Addendum Note (Signed)
Addended by: Christy Sartorius on: 08/10/2022 02:57 PM   Modules accepted: Orders

## 2022-08-10 NOTE — Progress Notes (Signed)
   Established Patient Office Visit  Subjective   Patient ID: Sally Brooks, female    DOB: 07-25-1960  Age: 62 y.o. MRN: [id number]  Chief Complaint  Patient presents with   Medical Management of Chronic Issues    HPI   Sally Brooks is seen for follow-up regarding hypothyroidism.  She takes Synthroid 75 mcg daily.  Compliant with therapy.  She takes this on empty stomach.  She feels well overall.  She stays active with gardening and walking.  Appetite and weight stable.  She made some dietary changes few years ago and has done excellent job maintaining her weight.  No specific complaints today.  Last labs were March 2023.  Past Medical History:  Diagnosis Date   FOLLICULITIS 01/20/2009   HYPOTHYROIDISM 01/20/2009   Past Surgical History:  Procedure Laterality Date   BREAST REDUCTION SURGERY      reports that she quit smoking about 43 years ago. Her smoking use included cigarettes. She has a 2.50 pack-year smoking history. She has never used smokeless tobacco. She reports current alcohol use of about 14.0 standard drinks of alcohol per week. She reports that she does not use drugs. family history includes Cancer in an other family member. No Known Allergies  Review of Systems  Constitutional:  Negative for chills, fever, malaise/fatigue and weight loss.      Objective:     BP 110/66 (BP Location: Left Arm, Patient Position: Sitting, Cuff Size: Normal)   Pulse 68   Temp 98 F (36.7 C) (Oral)   Ht 5' (1.524 m)   Wt 110 lb 3.2 oz (50 kg)   SpO2 99%   BMI 21.52 kg/m  BP Readings from Last 3 Encounters:  08/10/22 110/66  05/04/21 100/70  07/02/20 118/70   Wt Readings from Last 3 Encounters:  08/10/22 110 lb 3.2 oz (50 kg)  05/04/21 114 lb 6.4 oz (51.9 kg)  07/02/20 111 lb 8 oz (50.6 kg)      Physical Exam Vitals reviewed.  Constitutional:      Appearance: Normal appearance.  Cardiovascular:     Rate and Rhythm: Normal rate and regular rhythm.     Heart sounds: No  murmur heard. Pulmonary:     Effort: Pulmonary effort is normal.     Breath sounds: Normal breath sounds.  Musculoskeletal:     Cervical back: Neck supple. No tenderness.     Right lower leg: No edema.     Left lower leg: No edema.  Lymphadenopathy:     Cervical: No cervical adenopathy.  Neurological:     Mental Status: She is alert.      No results found for any visits on 08/10/22.  Last thyroid functions Lab Results  Component Value Date   TSH 5.35 05/04/2021      The 10-year ASCVD risk score (Arnett DK, et al., 2019) is: 3.5%    Assessment & Plan:   Problem List Items Addressed This Visit       Unprioritized   Hypothyroidism - Primary   Relevant Orders   TSH  Longstanding history of hypothyroidism.  Due for follow-up labs.  Recheck TSH and adjust medications if indicated.  Patient requesting brand-name Synthroid with refills.  Will wait to get labs back first to verify levels.  No follow-ups on file.    Sally Peat, MD

## 2022-10-15 ENCOUNTER — Other Ambulatory Visit: Payer: Self-pay | Admitting: Family Medicine

## 2022-10-28 ENCOUNTER — Other Ambulatory Visit: Payer: Self-pay | Admitting: Family Medicine

## 2023-01-25 ENCOUNTER — Other Ambulatory Visit: Payer: Self-pay | Admitting: Family Medicine

## 2023-02-23 ENCOUNTER — Other Ambulatory Visit: Payer: Self-pay | Admitting: Obstetrics and Gynecology

## 2023-02-23 DIAGNOSIS — M8580 Other specified disorders of bone density and structure, unspecified site: Secondary | ICD-10-CM

## 2023-03-04 ENCOUNTER — Encounter: Payer: Self-pay | Admitting: Gastroenterology

## 2023-04-19 ENCOUNTER — Ambulatory Visit (AMBULATORY_SURGERY_CENTER): Payer: 59

## 2023-04-19 ENCOUNTER — Other Ambulatory Visit: Payer: Self-pay

## 2023-04-19 VITALS — Ht 61.0 in | Wt 107.0 lb

## 2023-04-19 DIAGNOSIS — Z860100 Personal history of colon polyps, unspecified: Secondary | ICD-10-CM

## 2023-04-19 MED ORDER — SUFLAVE 178.7 G PO SOLR
1.0000 | ORAL | 0 refills | Status: DC
Start: 2023-04-19 — End: 2023-05-09

## 2023-04-19 NOTE — Progress Notes (Signed)
 Denies allergies to eggs or soy products. Denies complication of anesthesia or sedation. Denies use of weight loss medication. Denies use of O2.   Emmi instructions given for colonoscopy.

## 2023-04-22 ENCOUNTER — Other Ambulatory Visit: Payer: Self-pay | Admitting: Family Medicine

## 2023-04-23 ENCOUNTER — Other Ambulatory Visit: Payer: Self-pay | Admitting: Family Medicine

## 2023-05-03 ENCOUNTER — Encounter: Payer: 59 | Admitting: Gastroenterology

## 2023-05-03 ENCOUNTER — Encounter: Payer: Self-pay | Admitting: Gastroenterology

## 2023-05-05 ENCOUNTER — Telehealth: Payer: Self-pay | Admitting: Gastroenterology

## 2023-05-05 NOTE — Telephone Encounter (Signed)
 Patient called and stated that she was wanting to know if she can have apple cider vinegar and kombucha. Patient is scheduled for a procedure on March 24 th at 12:30. Patient is requesting a call back.Please advise.

## 2023-05-05 NOTE — Telephone Encounter (Signed)
 Spoke with pt all questions answered

## 2023-05-09 ENCOUNTER — Telehealth: Payer: Self-pay

## 2023-05-09 ENCOUNTER — Ambulatory Visit (AMBULATORY_SURGERY_CENTER): Payer: 59 | Admitting: Gastroenterology

## 2023-05-09 ENCOUNTER — Encounter: Payer: Self-pay | Admitting: Gastroenterology

## 2023-05-09 VITALS — BP 93/66 | HR 56 | Temp 97.3°F | Resp 14 | Ht 60.0 in | Wt 107.0 lb

## 2023-05-09 DIAGNOSIS — K648 Other hemorrhoids: Secondary | ICD-10-CM

## 2023-05-09 DIAGNOSIS — Z860101 Personal history of adenomatous and serrated colon polyps: Secondary | ICD-10-CM | POA: Diagnosis not present

## 2023-05-09 DIAGNOSIS — Z1211 Encounter for screening for malignant neoplasm of colon: Secondary | ICD-10-CM | POA: Diagnosis not present

## 2023-05-09 DIAGNOSIS — K644 Residual hemorrhoidal skin tags: Secondary | ICD-10-CM

## 2023-05-09 DIAGNOSIS — Z860100 Personal history of colon polyps, unspecified: Secondary | ICD-10-CM

## 2023-05-09 MED ORDER — SODIUM CHLORIDE 0.9 % IV SOLN
500.0000 mL | Freq: Once | INTRAVENOUS | Status: DC
Start: 2023-05-09 — End: 2023-05-09

## 2023-05-09 NOTE — Progress Notes (Deleted)
 Called to room to assist during endoscopic procedure.  Patient ID and intended procedure confirmed with present staff. Received instructions for my participation in the procedure from the performing physician.

## 2023-05-09 NOTE — Progress Notes (Signed)
 Pt's states no medical or surgical changes since previsit or office visit.

## 2023-05-09 NOTE — Patient Instructions (Addendum)
Resume previous diet.  Continue present medications.  Repeat colonoscopy in 10 years for surveillance.   YOU HAD AN ENDOSCOPIC PROCEDURE TODAY AT THE [facility]:   Refer to the procedure report that was given to you for any specific questions about what was found during the examination.  If the procedure report does not answer your questions, please call your gastroenterologist to clarify.  If you requested that your care partner not be given the details of your procedure findings, then the procedure report has been included in a sealed envelope for you to review at your convenience later.  YOU SHOULD EXPECT: Some feelings of bloating in the abdomen. Passage of more gas than usual.  Walking can help get rid of the air that was put into your GI tract during the procedure and reduce the bloating. If you had a lower endoscopy (such as a colonoscopy or flexible sigmoidoscopy) you may notice spotting of blood in your stool or on the toilet paper. If you underwent a bowel prep for your procedure, you may not have a normal bowel movement for a few days.  Please Note:  You might notice some irritation and congestion in your nose or some drainage.  This is from the oxygen used during your procedure.  There is no need for concern and it should clear up in a day or so.  SYMPTOMS TO REPORT IMMEDIATELY:  Following lower endoscopy (colonoscopy or flexible sigmoidoscopy):  Excessive amounts of blood in the stool  Significant tenderness or worsening of abdominal pains  Swelling of the abdomen that is new, acute  Fever of 100F or higher  For urgent or emergent issues, a gastroenterologist can be reached at any hour by calling [phone]. Do not use MyChart messaging for urgent concerns.    DIET:  We do recommend a small meal at first, but then you may proceed to your regular diet.  Drink plenty of fluids but you should avoid alcoholic beverages for 24 hours.  ACTIVITY:  You should plan to  take it easy for the rest of today and you should NOT DRIVE or use heavy machinery until tomorrow (because of the sedation medicines used during the test).    FOLLOW UP: Our staff will call the number listed on your records the next business day following your procedure.  We will call around 7:15- 8:00 am to check on you and address any questions or concerns that you may have regarding the information given to you following your procedure. If we do not reach you, we will leave a message.     If any biopsies were taken you will be contacted by phone or by letter within the next 1-3 weeks.  Please call us at [phone] if you have not heard about the biopsies in 3 weeks.    SIGNATURES/CONFIDENTIALITY: You and/or your care partner have signed paperwork which will be entered into your electronic medical record.  These signatures attest to the fact that that the information above on your After Visit Summary has been reviewed and is understood.  Full responsibility of the confidentiality of this discharge information lies with you and/or your care-partner. 

## 2023-05-09 NOTE — Op Note (Signed)
 [facility] Patient Name: Sally Brooks Procedure Date: 05/09/2023 1:08 PM MRN: [id number] Endoscopist: Napoleon Form , MD, [id number] Age: 63 Referring MD:  Date of Birth: 09-Jul-1960 Gender: Female Account #: [id number] Procedure:                Colonoscopy Indications:              High risk colon cancer surveillance: Personal                            history of colonic polyps Medicines:                Monitored Anesthesia Care Procedure:                Pre-Anesthesia Assessment:                           - Prior to the procedure, a History and Physical                            was performed, and patient medications and                            allergies were reviewed. The patient's tolerance of                            previous anesthesia was also reviewed. The risks                            and benefits of the procedure and the sedation                            options and risks were discussed with the patient.                            All questions were answered, and informed consent                            was obtained. Prior Anticoagulants: The patient has                            taken no anticoagulant or antiplatelet agents. ASA                            Grade Assessment: II - A patient with mild systemic                            disease. After reviewing the risks and benefits,                            the patient was deemed in satisfactory condition to                            undergo the procedure.  After obtaining informed consent, the colonoscope                            was passed under direct vision. Throughout the                            procedure, the patient's blood pressure, pulse, and                            oxygen saturations were monitored continuously. The                            PCF-HQ190L Colonoscope [id number] was introduced                            through the anus and advanced to  the the cecum,                            identified by appendiceal orifice and ileocecal                            valve. The colonoscopy was performed without                            difficulty. The patient tolerated the procedure                            well. The quality of the bowel preparation was                            good. The ileocecal valve, appendiceal orifice, and                            rectum were photographed. Scope In: 1:15:07 PM Scope Out: 1:32:08 PM Scope Withdrawal Time: 0 hours 12 minutes 58 seconds  Total Procedure Duration: 0 hours 17 minutes 1 second  Findings:                 The perianal and digital rectal examinations were                            normal.                           Non-bleeding external and internal hemorrhoids were                            found during retroflexion. The hemorrhoids were                            medium-sized.                           The exam was otherwise without abnormality. Complications:            No immediate complications. Estimated Blood Loss:  Estimated blood loss was minimal. Impression:               - Non-bleeding external and internal hemorrhoids.                           - The examination was otherwise normal.                           - No specimens collected. Recommendation:           - Patient has a contact number available for                            emergencies. The signs and symptoms of potential                            delayed complications were discussed with the                            patient. Return to normal activities tomorrow.                            Written discharge instructions were provided to the                            patient.                           - Resume previous diet.                           - Continue present medications.                           - Repeat colonoscopy in 10 years for surveillance. Napoleon Form, MD 05/09/2023 1:42:19  PM This report has been signed electronically.

## 2023-05-09 NOTE — Progress Notes (Signed)
 [facility] History and Physical   Primary Care Physician:  Kristian Covey, MD   Reason for Procedure:  History of adenomatous colon polyps  Plan:    Surveillance colonoscopy with possible interventions as needed     HPI: Sally Brooks is a very pleasant 63 y.o. female here for surveillance colonoscopy. Denies any nausea, vomiting, abdominal pain, melena or bright red blood per rectum  The risks and benefits as well as alternatives of endoscopic procedure(s) have been discussed and reviewed. All questions answered. The patient agrees to proceed.    Past Medical History:  Diagnosis Date   Cancer [facility])    Cataract    FOLLICULITIS 01/20/2009   HYPOTHYROIDISM 01/20/2009    Past Surgical History:  Procedure Laterality Date   BREAST REDUCTION SURGERY      Prior to Admission medications   Medication Sig Start Date End Date Taking? Authorizing Provider  Biotin 5000 MCG CAPS Take 2 capsules by mouth daily.   Yes [provider]  Cyanocobalamin (VITAMIN B-12 PO) Take by mouth.   Yes [provider]  OVER THE COUNTER MEDICATION [facility], one capsule daily.   Yes [provider]  Probiotic Product (ALIGN PO) Take by mouth. One scoop daily.   Yes [provider]  SYNTHROID 88 MCG tablet TAKE ONE TABLET BY MOUTH ONCE A DAY 04/22/23  Yes Burchette, Elberta Fortis, MD  traZODone (DESYREL) 50 MG tablet Take 1 tablet by mouth at bedtime as needed for sleep 04/25/23  Yes Burchette, Elberta Fortis, MD  UNABLE TO FIND c-salts buffered(powder vit c ) 2 tsp every am   Yes [provider]  VITAMIN D PO Take by mouth.   Yes [provider]  UNABLE TO FIND Med Name: Bioshield Patient not taking: Reported on 04/19/2023    [provider]    Current Outpatient Medications  Medication Sig Dispense Refill   Biotin 5000 MCG CAPS Take 2 capsules by mouth daily.     Cyanocobalamin (VITAMIN B-12 PO) Take by mouth.     OVER THE COUNTER  MEDICATION [facility], one capsule daily.     Probiotic Product (ALIGN PO) Take by mouth. One scoop daily.     SYNTHROID 88 MCG tablet TAKE ONE TABLET BY MOUTH ONCE A DAY 90 tablet 0   traZODone (DESYREL) 50 MG tablet Take 1 tablet by mouth at bedtime as needed for sleep 90 tablet 1   UNABLE TO FIND c-salts buffered(powder vit c ) 2 tsp every am     VITAMIN D PO Take by mouth.     UNABLE TO FIND Med Name: Tomasa Rand (Patient not taking: Reported on 04/19/2023)     Current Facility-Administered Medications  Medication Dose Route Frequency Provider Last Rate Last Admin   0.9 %  sodium chloride infusion  500 mL Intravenous Once Napoleon Form, MD        Allergies as of 05/09/2023   (No Known Allergies)    Family History  Problem Relation Age of Onset   Cancer Other        lung   Colon cancer Neg Hx    Colon polyps Neg Hx    Esophageal cancer Neg Hx    Stomach cancer Neg Hx    Rectal cancer Neg Hx     Social History   Socioeconomic History   Marital status: Married    Spouse name: Not on file   Number of children: Not on file   Years of education:  Not on file   Highest education level: Not on file  Occupational History   Not on file  Tobacco Use   Smoking status: Former    Current packs/day: 0.00    Average packs/day: 0.5 packs/day for 5.0 years (2.5 ttl pk-yrs)    Types: Cigarettes    Start date: 12/17/1973    Quit date: 12/18/1978    Years since quitting: 44.4   Smokeless tobacco: Never  Vaping Use   Vaping status: Never Used  Substance and Sexual Activity   Alcohol use: Yes    Alcohol/week: 14.0 standard drinks of alcohol    Types: 14 Glasses of wine per week    Comment: one glass of wine at night.   Drug use: Never   Sexual activity: Not on file  Other Topics Concern   Not on file  Social History Narrative   Not on file   Social Drivers of Health   Financial Resource Strain: Not on file  Food Insecurity: Not on file  Transportation Needs: Not on file   Physical Activity: Not on file  Stress: Not on file  Social Connections: Not on file  Intimate Partner Violence: Not on file    Review of Systems:  All other review of systems negative except as mentioned in the HPI.  Physical Exam: Vital signs in last 24 hours: BP 116/65   Pulse 64   Temp (!) 97.3 F (36.3 C) (Skin)   Resp 12   Ht 5' (1.524 m)   Wt 107 lb (48.5 kg)   SpO2 100%   BMI 20.90 kg/m  General:   Alert, NAD Lungs:  Clear .   Heart:  Regular rate and rhythm Abdomen:  Soft, nontender and nondistended. Neuro/Psych:  Alert and cooperative. Normal mood and affect. A and O x 3  Reviewed labs, radiology imaging, old records and pertinent past GI work up  Patient is appropriate for planned procedure(s) and anesthesia in an ambulatory setting   K. Scherry Ran , MD [phone]

## 2023-05-09 NOTE — Progress Notes (Signed)
 To pacu, VSS. Report to Rn.tb

## 2023-05-10 ENCOUNTER — Telehealth: Payer: Self-pay

## 2023-05-10 NOTE — Telephone Encounter (Signed)
  Follow up Call-     05/09/2023   12:38 PM  Call back number  Post procedure Call Back phone  # [phone]  Permission to leave phone message Yes     Patient questions:  Do you have a fever, pain , or abdominal swelling? No. Pain Score  0 *  Have you tolerated food without any problems? Yes.    Have you been able to return to your normal activities? Yes.    Do you have any questions about your discharge instructions: Diet   No. Medications  No. Follow up visit  No.  Do you have questions or concerns about your Care? No.  Actions: * If pain score is 4 or above: No action needed, pain <4.

## 2023-06-17 ENCOUNTER — Telehealth: Payer: Self-pay

## 2023-06-17 DIAGNOSIS — E039 Hypothyroidism, unspecified: Secondary | ICD-10-CM

## 2023-06-17 NOTE — Telephone Encounter (Signed)
 Copied from CRM [id number]. Topic: Clinical - Prescription Issue >> Jun 17, 2023  3:59 PM [name] wrote: Reason for CRM: Patient calling to switch her prescription for SYNTHROID  88 MCG tablet from the name brand to generic because the price has gone up for it, and she would like it to remain a 90 day supply.  [facility] # [address] [place] - [address] [place] [address] Phone: [phone] Fax: [phone]

## 2023-06-20 MED ORDER — LEVOTHYROXINE SODIUM 88 MCG PO TABS
88.0000 ug | ORAL_TABLET | Freq: Every day | ORAL | 0 refills | Status: DC
Start: 2023-06-20 — End: 2023-11-07

## 2023-06-20 NOTE — Telephone Encounter (Signed)
Patient informed of the message below and rx sent.

## 2023-06-20 NOTE — Addendum Note (Signed)
 Addended by: Aurelio Leer on: 06/20/2023 10:25 AM   Modules accepted: Orders

## 2023-09-05 DIAGNOSIS — H524 Presbyopia: Secondary | ICD-10-CM | POA: Diagnosis not present

## 2023-10-04 DIAGNOSIS — L570 Actinic keratosis: Secondary | ICD-10-CM | POA: Diagnosis not present

## 2023-10-06 NOTE — Telephone Encounter (Signed)
 Post procedure follow up call, no answer

## 2023-10-13 ENCOUNTER — Other Ambulatory Visit: Payer: Self-pay | Admitting: Family Medicine

## 2023-10-19 ENCOUNTER — Other Ambulatory Visit: Payer: 59

## 2023-10-19 ENCOUNTER — Ambulatory Visit (HOSPITAL_BASED_OUTPATIENT_CLINIC_OR_DEPARTMENT_OTHER)
Admission: RE | Admit: 2023-10-19 | Discharge: 2023-10-19 | Disposition: A | Discharge status: Home / Self Care | Source: Ambulatory Visit | Attending: Obstetrics and Gynecology | Admitting: Obstetrics and Gynecology

## 2023-10-19 DIAGNOSIS — M810 Age-related osteoporosis without current pathological fracture: Secondary | ICD-10-CM | POA: Diagnosis not present

## 2023-10-19 DIAGNOSIS — M8580 Other specified disorders of bone density and structure, unspecified site: Secondary | ICD-10-CM | POA: Diagnosis not present

## 2023-10-19 DIAGNOSIS — Z13820 Encounter for screening for osteoporosis: Secondary | ICD-10-CM | POA: Insufficient documentation

## 2023-10-19 DIAGNOSIS — Z780 Asymptomatic menopausal state: Secondary | ICD-10-CM | POA: Diagnosis not present

## 2023-11-04 ENCOUNTER — Other Ambulatory Visit: Payer: Self-pay | Admitting: Family Medicine

## 2023-11-09 ENCOUNTER — Other Ambulatory Visit: Payer: Self-pay | Admitting: Family Medicine

## 2023-11-21 DIAGNOSIS — K080 Exfoliation of teeth due to systemic causes: Secondary | ICD-10-CM | POA: Diagnosis not present

## 2023-12-01 ENCOUNTER — Other Ambulatory Visit: Payer: Self-pay | Admitting: Family Medicine

## 2023-12-12 ENCOUNTER — Other Ambulatory Visit: Payer: Self-pay | Admitting: Adult Health

## 2023-12-30 ENCOUNTER — Other Ambulatory Visit: Payer: Self-pay | Admitting: Family Medicine

## 2024-01-13 ENCOUNTER — Other Ambulatory Visit: Payer: Self-pay | Admitting: Family Medicine

## 2024-01-30 ENCOUNTER — Other Ambulatory Visit: Payer: Self-pay | Admitting: Family Medicine

## 2024-01-30 ENCOUNTER — Telehealth: Payer: Self-pay | Admitting: Family Medicine

## 2024-01-30 NOTE — Telephone Encounter (Unsigned)
 Copied from CRM #[id number]. Topic: Clinical - Medication Refill >> Jan 30, 2024  4:57 PM Shereese L wrote: Medication: traZODone  (DESYREL ) 50 MG tablet levothyroxine  (SYNTHROID ) 88 MCG tablet  Has the patient contacted their pharmacy? Yes (Agent: If no, request that the patient contact the pharmacy for the refill. If patient does not wish to contact the pharmacy document the reason why and proceed with request.) (Agent: If yes, when and what did the pharmacy advise?)  This is the patient's preferred pharmacy:   [facility] # [address] [place] - [address] [place] [address] Phone: [phone] Fax: [phone]    Is this the correct pharmacy for this prescription? Yes If no, delete pharmacy and type the correct one.   Has the prescription been filled recently? Yes  Is the patient out of the medication? Yes  Has the patient been seen for an appointment in the last year OR does the patient have an upcoming appointment? Yes  Can we respond through MyChart? Yes  Agent: Please be advised that Rx refills may take up to 3 business days. We ask that you follow-up with your pharmacy.

## 2024-02-02 ENCOUNTER — Other Ambulatory Visit: Payer: Self-pay | Admitting: Family Medicine

## 2024-02-06 ENCOUNTER — Encounter: Payer: Self-pay | Admitting: Family Medicine

## 2024-02-06 ENCOUNTER — Ambulatory Visit: Admitting: Family Medicine

## 2024-02-06 ENCOUNTER — Ambulatory Visit: Payer: Self-pay | Admitting: Family Medicine

## 2024-02-06 VITALS — BP 108/60 | HR 72 | Temp 97.9°F | Wt 111.4 lb

## 2024-02-06 DIAGNOSIS — E039 Hypothyroidism, unspecified: Secondary | ICD-10-CM | POA: Diagnosis not present

## 2024-02-06 DIAGNOSIS — F5104 Psychophysiologic insomnia: Secondary | ICD-10-CM | POA: Diagnosis not present

## 2024-02-06 LAB — T4, FREE: Free T4: 0.98 ng/dL (ref 0.60–1.60)

## 2024-02-06 LAB — TSH: TSH: 3.58 u[IU]/mL (ref 0.35–5.50)

## 2024-02-06 MED ORDER — TRAZODONE HCL 50 MG PO TABS: 50.0000 mg | ORAL_TABLET | Freq: Every evening | ORAL | 3 refills | Status: AC | PRN

## 2024-02-06 MED ORDER — LEVOTHYROXINE SODIUM 88 MCG PO TABS: 88.0000 ug | ORAL_TABLET | Freq: Every day | ORAL | 3 refills | Status: AC

## 2024-02-06 NOTE — Progress Notes (Signed)
" ° °  Established Patient Office Visit  Subjective   Patient ID: Sally Brooks, female    DOB: 03-21-1960  Age: 63 y.o. MRN: [id number]  Chief Complaint  Patient presents with   Medication Refill    HPI    Sally Brooks is seen today for medical follow-up.  Very health-conscious.  She eats a very healthy diet and stays very active.  Has done a great job maintaining her current weight.  She has hypothyroidism and is currently on levothyroxine  88 mcg daily.  She also has history of chronic insomnia and takes trazodone  50 mg nightly which is working well for her.  Requesting refills.  She is due for follow-up labs.  Over a year since she had thyroid  functions tested.  Past Medical History:  Diagnosis Date   Cancer [facility])    Cataract    FOLLICULITIS 01/20/2009   HYPOTHYROIDISM 01/20/2009   Past Surgical History:  Procedure Laterality Date   BREAST REDUCTION SURGERY      reports that she quit smoking about 45 years ago. Her smoking use included cigarettes. She started smoking about 50 years ago. She has a 2.5 pack-year smoking history. She has never used smokeless tobacco. She reports current alcohol use of about 14.0 standard drinks of alcohol per week. She reports that she does not use drugs. family history includes Cancer in an other family member. Allergies[1]  Review of Systems  Constitutional:  Negative for malaise/fatigue.  Eyes:  Negative for blurred vision.  Respiratory:  Negative for shortness of breath.   Cardiovascular:  Negative for chest pain.  Neurological:  Negative for dizziness, weakness and headaches.      Objective:     BP 108/60   Pulse 72   Temp 97.9 F (36.6 C) (Oral)   Wt 111 lb 6.4 oz (50.5 kg)   SpO2 98%   BMI 21.76 kg/m  BP Readings from Last 3 Encounters:  02/06/24 108/60  05/09/23 93/66  08/10/22 110/66   Wt Readings from Last 3 Encounters:  02/06/24 111 lb 6.4 oz (50.5 kg)  05/09/23 107 lb (48.5 kg)  04/19/23 107 lb (48.5 kg)      Physical  Exam Vitals reviewed.  Constitutional:      Appearance: She is well-developed.  Neck:     Thyroid : No thyromegaly.     Vascular: No JVD.  Cardiovascular:     Rate and Rhythm: Normal rate and regular rhythm.     Heart sounds: No murmur heard.    No gallop.  Pulmonary:     Effort: Pulmonary effort is normal. No respiratory distress.     Breath sounds: Normal breath sounds. No wheezing or rales.  Musculoskeletal:     Cervical back: Neck supple.  Lymphadenopathy:     Cervical: No cervical adenopathy.  Neurological:     Mental Status: She is alert.      No results found for any visits on 02/06/24.    The ASCVD Risk score (Arnett DK, et al., 2019) failed to calculate for the following reasons:   The valid HDL cholesterol range is 20 to 100 mg/dL    Assessment & Plan:   #1 hypothyroidism.  Recheck TSH and free T4.  Refill levothyroxine  for 1 year.  #2 chronic insomnia.  Patient has been on trazodone  which is working well.  Refill trazodone  for 1 year.   Wolm Scarlet, MD     [1] No Known Allergies  "
